# Patient Record
Sex: Male | Born: 1940
Health system: Southern US, Community
[De-identification: ages and names within clinical notes are randomized; demographics above are authoritative.]

## PROBLEM LIST (undated history)

## (undated) DIAGNOSIS — I739 Peripheral vascular disease, unspecified: Secondary | ICD-10-CM

## (undated) DIAGNOSIS — I779 Disorder of arteries and arterioles, unspecified: Secondary | ICD-10-CM

## (undated) DIAGNOSIS — Z9289 Personal history of other medical treatment: Secondary | ICD-10-CM

## (undated) DIAGNOSIS — T7840XA Allergy, unspecified, initial encounter: Secondary | ICD-10-CM

## (undated) DIAGNOSIS — I10 Essential (primary) hypertension: Secondary | ICD-10-CM

## (undated) DIAGNOSIS — J449 Chronic obstructive pulmonary disease, unspecified: Secondary | ICD-10-CM

## (undated) DIAGNOSIS — E1169 Type 2 diabetes mellitus with other specified complication: Secondary | ICD-10-CM

## (undated) DIAGNOSIS — J45909 Unspecified asthma, uncomplicated: Secondary | ICD-10-CM

## (undated) DIAGNOSIS — Z8673 Personal history of transient ischemic attack (TIA), and cerebral infarction without residual deficits: Secondary | ICD-10-CM

## (undated) DIAGNOSIS — C457 Mesothelioma of other sites: Secondary | ICD-10-CM

## (undated) DIAGNOSIS — E785 Hyperlipidemia, unspecified: Secondary | ICD-10-CM

## (undated) HISTORY — DX: Personal history of transient ischemic attack (TIA), and cerebral infarction without residual deficits: Z86.73

## (undated) HISTORY — DX: Peripheral vascular disease, unspecified: I73.9

## (undated) HISTORY — DX: Personal history of other medical treatment: Z92.89

## (undated) HISTORY — DX: Hyperlipidemia, unspecified: E78.5

## (undated) HISTORY — DX: Allergy, unspecified, initial encounter: T78.40XA

## (undated) HISTORY — PX: CATARACT EXTRACTION: SUR2

## (undated) HISTORY — DX: Disorder of arteries and arterioles, unspecified: I77.9

## (undated) HISTORY — PX: SKIN GRAFT: SHX250

## (undated) HISTORY — PX: EYE SURGERY: SHX253

## (undated) HISTORY — DX: Type 2 diabetes mellitus with other specified complication: E11.69

## (undated) HISTORY — PX: TONSILLECTOMY: SUR1361

## (undated) HISTORY — DX: Unspecified asthma, uncomplicated: J45.909

---

## 2012-05-04 DIAGNOSIS — Z23 Encounter for immunization: Secondary | ICD-10-CM | POA: Diagnosis not present

## 2013-01-19 DIAGNOSIS — H524 Presbyopia: Secondary | ICD-10-CM | POA: Diagnosis not present

## 2013-01-19 DIAGNOSIS — IMO0002 Reserved for concepts with insufficient information to code with codable children: Secondary | ICD-10-CM | POA: Diagnosis not present

## 2013-01-19 DIAGNOSIS — H5200 Hypermetropia, unspecified eye: Secondary | ICD-10-CM | POA: Diagnosis not present

## 2013-01-19 DIAGNOSIS — H52229 Regular astigmatism, unspecified eye: Secondary | ICD-10-CM | POA: Diagnosis not present

## 2013-07-12 DIAGNOSIS — IMO0002 Reserved for concepts with insufficient information to code with codable children: Secondary | ICD-10-CM | POA: Diagnosis not present

## 2013-07-12 DIAGNOSIS — J209 Acute bronchitis, unspecified: Secondary | ICD-10-CM | POA: Diagnosis not present

## 2013-07-12 DIAGNOSIS — Z125 Encounter for screening for malignant neoplasm of prostate: Secondary | ICD-10-CM | POA: Diagnosis not present

## 2013-07-28 DIAGNOSIS — Z23 Encounter for immunization: Secondary | ICD-10-CM | POA: Diagnosis not present

## 2014-03-27 DIAGNOSIS — J45909 Unspecified asthma, uncomplicated: Secondary | ICD-10-CM | POA: Diagnosis not present

## 2014-03-27 DIAGNOSIS — J309 Allergic rhinitis, unspecified: Secondary | ICD-10-CM | POA: Diagnosis not present

## 2014-03-27 DIAGNOSIS — Z6825 Body mass index (BMI) 25.0-25.9, adult: Secondary | ICD-10-CM | POA: Diagnosis not present

## 2014-06-21 DIAGNOSIS — H3530 Unspecified macular degeneration: Secondary | ICD-10-CM | POA: Diagnosis not present

## 2014-06-21 DIAGNOSIS — H524 Presbyopia: Secondary | ICD-10-CM | POA: Diagnosis not present

## 2014-06-21 DIAGNOSIS — H3531 Nonexudative age-related macular degeneration: Secondary | ICD-10-CM | POA: Diagnosis not present

## 2014-06-21 DIAGNOSIS — H35373 Puckering of macula, bilateral: Secondary | ICD-10-CM | POA: Diagnosis not present

## 2014-06-21 DIAGNOSIS — H5202 Hypermetropia, left eye: Secondary | ICD-10-CM | POA: Diagnosis not present

## 2014-06-21 DIAGNOSIS — H5201 Hypermetropia, right eye: Secondary | ICD-10-CM | POA: Diagnosis not present

## 2014-06-21 DIAGNOSIS — H25813 Combined forms of age-related cataract, bilateral: Secondary | ICD-10-CM | POA: Diagnosis not present

## 2014-06-21 DIAGNOSIS — H52222 Regular astigmatism, left eye: Secondary | ICD-10-CM | POA: Diagnosis not present

## 2014-06-22 DIAGNOSIS — Z23 Encounter for immunization: Secondary | ICD-10-CM | POA: Diagnosis not present

## 2014-09-10 DIAGNOSIS — Z6826 Body mass index (BMI) 26.0-26.9, adult: Secondary | ICD-10-CM | POA: Diagnosis not present

## 2014-09-10 DIAGNOSIS — J449 Chronic obstructive pulmonary disease, unspecified: Secondary | ICD-10-CM | POA: Diagnosis not present

## 2014-09-10 DIAGNOSIS — Z720 Tobacco use: Secondary | ICD-10-CM | POA: Diagnosis not present

## 2014-09-10 DIAGNOSIS — J321 Chronic frontal sinusitis: Secondary | ICD-10-CM | POA: Diagnosis not present

## 2014-09-10 DIAGNOSIS — R0602 Shortness of breath: Secondary | ICD-10-CM | POA: Diagnosis not present

## 2014-10-18 DIAGNOSIS — Z125 Encounter for screening for malignant neoplasm of prostate: Secondary | ICD-10-CM | POA: Diagnosis not present

## 2014-10-18 DIAGNOSIS — J61 Pneumoconiosis due to asbestos and other mineral fibers: Secondary | ICD-10-CM | POA: Diagnosis not present

## 2014-10-18 DIAGNOSIS — J45909 Unspecified asthma, uncomplicated: Secondary | ICD-10-CM | POA: Diagnosis not present

## 2014-10-18 DIAGNOSIS — Z9181 History of falling: Secondary | ICD-10-CM | POA: Diagnosis not present

## 2014-10-18 DIAGNOSIS — Z1211 Encounter for screening for malignant neoplasm of colon: Secondary | ICD-10-CM | POA: Diagnosis not present

## 2014-10-18 DIAGNOSIS — R635 Abnormal weight gain: Secondary | ICD-10-CM | POA: Diagnosis not present

## 2014-10-18 DIAGNOSIS — Z720 Tobacco use: Secondary | ICD-10-CM | POA: Diagnosis not present

## 2014-10-18 DIAGNOSIS — E785 Hyperlipidemia, unspecified: Secondary | ICD-10-CM | POA: Diagnosis not present

## 2014-10-18 DIAGNOSIS — Z1389 Encounter for screening for other disorder: Secondary | ICD-10-CM | POA: Diagnosis not present

## 2014-10-18 DIAGNOSIS — Z23 Encounter for immunization: Secondary | ICD-10-CM | POA: Diagnosis not present

## 2014-10-18 DIAGNOSIS — Z6826 Body mass index (BMI) 26.0-26.9, adult: Secondary | ICD-10-CM | POA: Diagnosis not present

## 2014-10-18 DIAGNOSIS — Z0000 Encounter for general adult medical examination without abnormal findings: Secondary | ICD-10-CM | POA: Diagnosis not present

## 2014-11-29 DIAGNOSIS — J208 Acute bronchitis due to other specified organisms: Secondary | ICD-10-CM | POA: Diagnosis not present

## 2014-11-29 DIAGNOSIS — Z6825 Body mass index (BMI) 25.0-25.9, adult: Secondary | ICD-10-CM | POA: Diagnosis not present

## 2014-12-31 DIAGNOSIS — H3531 Nonexudative age-related macular degeneration: Secondary | ICD-10-CM | POA: Diagnosis not present

## 2015-05-09 DIAGNOSIS — Z23 Encounter for immunization: Secondary | ICD-10-CM | POA: Diagnosis not present

## 2015-05-21 DIAGNOSIS — J208 Acute bronchitis due to other specified organisms: Secondary | ICD-10-CM | POA: Diagnosis not present

## 2015-05-21 DIAGNOSIS — Z6825 Body mass index (BMI) 25.0-25.9, adult: Secondary | ICD-10-CM | POA: Diagnosis not present

## 2015-07-12 DIAGNOSIS — Z6824 Body mass index (BMI) 24.0-24.9, adult: Secondary | ICD-10-CM | POA: Diagnosis not present

## 2015-07-12 DIAGNOSIS — J019 Acute sinusitis, unspecified: Secondary | ICD-10-CM | POA: Diagnosis not present

## 2015-07-12 DIAGNOSIS — J208 Acute bronchitis due to other specified organisms: Secondary | ICD-10-CM | POA: Diagnosis not present

## 2015-07-23 DIAGNOSIS — H52223 Regular astigmatism, bilateral: Secondary | ICD-10-CM | POA: Diagnosis not present

## 2015-07-23 DIAGNOSIS — H524 Presbyopia: Secondary | ICD-10-CM | POA: Diagnosis not present

## 2015-07-23 DIAGNOSIS — H5201 Hypermetropia, right eye: Secondary | ICD-10-CM | POA: Diagnosis not present

## 2015-07-23 DIAGNOSIS — H43813 Vitreous degeneration, bilateral: Secondary | ICD-10-CM | POA: Diagnosis not present

## 2015-07-23 DIAGNOSIS — H25813 Combined forms of age-related cataract, bilateral: Secondary | ICD-10-CM | POA: Diagnosis not present

## 2015-07-23 DIAGNOSIS — H35373 Puckering of macula, bilateral: Secondary | ICD-10-CM | POA: Diagnosis not present

## 2015-07-23 DIAGNOSIS — H353131 Nonexudative age-related macular degeneration, bilateral, early dry stage: Secondary | ICD-10-CM | POA: Diagnosis not present

## 2015-07-23 DIAGNOSIS — H43393 Other vitreous opacities, bilateral: Secondary | ICD-10-CM | POA: Diagnosis not present

## 2015-07-23 DIAGNOSIS — H43313 Vitreous membranes and strands, bilateral: Secondary | ICD-10-CM | POA: Diagnosis not present

## 2015-10-03 DIAGNOSIS — H2513 Age-related nuclear cataract, bilateral: Secondary | ICD-10-CM | POA: Diagnosis not present

## 2015-10-03 DIAGNOSIS — H35363 Drusen (degenerative) of macula, bilateral: Secondary | ICD-10-CM | POA: Diagnosis not present

## 2015-10-03 DIAGNOSIS — H25813 Combined forms of age-related cataract, bilateral: Secondary | ICD-10-CM | POA: Diagnosis not present

## 2015-10-03 DIAGNOSIS — H353131 Nonexudative age-related macular degeneration, bilateral, early dry stage: Secondary | ICD-10-CM | POA: Diagnosis not present

## 2015-10-03 DIAGNOSIS — H524 Presbyopia: Secondary | ICD-10-CM | POA: Diagnosis not present

## 2015-10-03 DIAGNOSIS — H35453 Secondary pigmentary degeneration, bilateral: Secondary | ICD-10-CM | POA: Diagnosis not present

## 2015-10-03 DIAGNOSIS — H04123 Dry eye syndrome of bilateral lacrimal glands: Secondary | ICD-10-CM | POA: Diagnosis not present

## 2015-10-07 DIAGNOSIS — Z720 Tobacco use: Secondary | ICD-10-CM | POA: Diagnosis not present

## 2015-10-07 DIAGNOSIS — R0982 Postnasal drip: Secondary | ICD-10-CM | POA: Diagnosis not present

## 2015-10-07 DIAGNOSIS — Z6825 Body mass index (BMI) 25.0-25.9, adult: Secondary | ICD-10-CM | POA: Diagnosis not present

## 2015-10-14 DIAGNOSIS — H2512 Age-related nuclear cataract, left eye: Secondary | ICD-10-CM | POA: Diagnosis not present

## 2015-10-28 DIAGNOSIS — J929 Pleural plaque without asbestos: Secondary | ICD-10-CM | POA: Diagnosis not present

## 2015-10-28 DIAGNOSIS — J208 Acute bronchitis due to other specified organisms: Secondary | ICD-10-CM | POA: Diagnosis not present

## 2015-10-28 DIAGNOSIS — Z6824 Body mass index (BMI) 24.0-24.9, adult: Secondary | ICD-10-CM | POA: Diagnosis not present

## 2015-10-28 DIAGNOSIS — Z720 Tobacco use: Secondary | ICD-10-CM | POA: Diagnosis not present

## 2015-10-28 DIAGNOSIS — J61 Pneumoconiosis due to asbestos and other mineral fibers: Secondary | ICD-10-CM | POA: Diagnosis not present

## 2015-10-28 DIAGNOSIS — J449 Chronic obstructive pulmonary disease, unspecified: Secondary | ICD-10-CM | POA: Diagnosis not present

## 2015-11-11 DIAGNOSIS — H25812 Combined forms of age-related cataract, left eye: Secondary | ICD-10-CM | POA: Diagnosis not present

## 2015-11-11 DIAGNOSIS — H2512 Age-related nuclear cataract, left eye: Secondary | ICD-10-CM | POA: Diagnosis not present

## 2015-11-19 DIAGNOSIS — H2511 Age-related nuclear cataract, right eye: Secondary | ICD-10-CM | POA: Diagnosis not present

## 2015-11-25 DIAGNOSIS — H25811 Combined forms of age-related cataract, right eye: Secondary | ICD-10-CM | POA: Diagnosis not present

## 2015-11-25 DIAGNOSIS — H2511 Age-related nuclear cataract, right eye: Secondary | ICD-10-CM | POA: Diagnosis not present

## 2015-12-13 DIAGNOSIS — H3581 Retinal edema: Secondary | ICD-10-CM | POA: Diagnosis not present

## 2015-12-13 DIAGNOSIS — H35371 Puckering of macula, right eye: Secondary | ICD-10-CM | POA: Diagnosis not present

## 2015-12-13 DIAGNOSIS — H43811 Vitreous degeneration, right eye: Secondary | ICD-10-CM | POA: Diagnosis not present

## 2015-12-13 DIAGNOSIS — H43823 Vitreomacular adhesion, bilateral: Secondary | ICD-10-CM | POA: Diagnosis not present

## 2015-12-16 DIAGNOSIS — Z720 Tobacco use: Secondary | ICD-10-CM | POA: Diagnosis not present

## 2015-12-16 DIAGNOSIS — J920 Pleural plaque with presence of asbestos: Secondary | ICD-10-CM | POA: Diagnosis not present

## 2015-12-16 DIAGNOSIS — R0982 Postnasal drip: Secondary | ICD-10-CM | POA: Diagnosis not present

## 2015-12-16 DIAGNOSIS — J449 Chronic obstructive pulmonary disease, unspecified: Secondary | ICD-10-CM | POA: Diagnosis not present

## 2015-12-30 DIAGNOSIS — Z720 Tobacco use: Secondary | ICD-10-CM | POA: Diagnosis not present

## 2015-12-30 DIAGNOSIS — Z6825 Body mass index (BMI) 25.0-25.9, adult: Secondary | ICD-10-CM | POA: Diagnosis not present

## 2015-12-30 DIAGNOSIS — J449 Chronic obstructive pulmonary disease, unspecified: Secondary | ICD-10-CM | POA: Diagnosis not present

## 2015-12-30 DIAGNOSIS — E663 Overweight: Secondary | ICD-10-CM | POA: Diagnosis not present

## 2016-02-14 DIAGNOSIS — R29810 Facial weakness: Secondary | ICD-10-CM | POA: Diagnosis not present

## 2016-02-14 DIAGNOSIS — R531 Weakness: Secondary | ICD-10-CM | POA: Diagnosis not present

## 2016-02-14 DIAGNOSIS — J9690 Respiratory failure, unspecified, unspecified whether with hypoxia or hypercapnia: Secondary | ICD-10-CM | POA: Diagnosis not present

## 2016-02-14 DIAGNOSIS — Z716 Tobacco abuse counseling: Secondary | ICD-10-CM | POA: Diagnosis not present

## 2016-02-14 DIAGNOSIS — J449 Chronic obstructive pulmonary disease, unspecified: Secondary | ICD-10-CM | POA: Diagnosis not present

## 2016-02-14 DIAGNOSIS — Z7982 Long term (current) use of aspirin: Secondary | ICD-10-CM | POA: Diagnosis not present

## 2016-02-14 DIAGNOSIS — I639 Cerebral infarction, unspecified: Secondary | ICD-10-CM | POA: Diagnosis not present

## 2016-02-14 DIAGNOSIS — R200 Anesthesia of skin: Secondary | ICD-10-CM | POA: Diagnosis not present

## 2016-02-14 DIAGNOSIS — Z79899 Other long term (current) drug therapy: Secondary | ICD-10-CM | POA: Diagnosis not present

## 2016-02-14 DIAGNOSIS — F17210 Nicotine dependence, cigarettes, uncomplicated: Secondary | ICD-10-CM | POA: Diagnosis not present

## 2016-02-14 DIAGNOSIS — G459 Transient cerebral ischemic attack, unspecified: Secondary | ICD-10-CM | POA: Diagnosis not present

## 2016-02-14 DIAGNOSIS — E785 Hyperlipidemia, unspecified: Secondary | ICD-10-CM | POA: Diagnosis not present

## 2016-02-14 DIAGNOSIS — R29818 Other symptoms and signs involving the nervous system: Secondary | ICD-10-CM | POA: Diagnosis not present

## 2016-02-15 DIAGNOSIS — I639 Cerebral infarction, unspecified: Secondary | ICD-10-CM | POA: Diagnosis not present

## 2016-02-15 DIAGNOSIS — I361 Nonrheumatic tricuspid (valve) insufficiency: Secondary | ICD-10-CM | POA: Diagnosis not present

## 2016-02-15 DIAGNOSIS — I6523 Occlusion and stenosis of bilateral carotid arteries: Secondary | ICD-10-CM | POA: Diagnosis not present

## 2016-02-17 DIAGNOSIS — E785 Hyperlipidemia, unspecified: Secondary | ICD-10-CM | POA: Diagnosis not present

## 2016-02-17 DIAGNOSIS — Z6825 Body mass index (BMI) 25.0-25.9, adult: Secondary | ICD-10-CM | POA: Diagnosis not present

## 2016-02-17 DIAGNOSIS — Z720 Tobacco use: Secondary | ICD-10-CM | POA: Diagnosis not present

## 2016-02-17 DIAGNOSIS — I6521 Occlusion and stenosis of right carotid artery: Secondary | ICD-10-CM | POA: Diagnosis not present

## 2016-02-17 DIAGNOSIS — Z79899 Other long term (current) drug therapy: Secondary | ICD-10-CM | POA: Diagnosis not present

## 2016-02-17 DIAGNOSIS — Z125 Encounter for screening for malignant neoplasm of prostate: Secondary | ICD-10-CM | POA: Diagnosis not present

## 2016-02-25 ENCOUNTER — Encounter: Payer: Self-pay | Admitting: Vascular Surgery

## 2016-02-28 ENCOUNTER — Ambulatory Visit (INDEPENDENT_AMBULATORY_CARE_PROVIDER_SITE_OTHER): Payer: Medicare Other | Admitting: Vascular Surgery

## 2016-02-28 ENCOUNTER — Ambulatory Visit (HOSPITAL_COMMUNITY)
Admission: RE | Admit: 2016-02-28 | Discharge: 2016-02-28 | Disposition: A | Discharge status: Home / Self Care | Payer: Medicare Other | Source: Ambulatory Visit | Attending: Vascular Surgery | Admitting: Vascular Surgery

## 2016-02-28 ENCOUNTER — Encounter: Payer: Self-pay | Admitting: Vascular Surgery

## 2016-02-28 VITALS — BP 141/86 | HR 89 | Temp 98.3°F | Resp 18 | Ht 70.5 in | Wt 174.0 lb

## 2016-02-28 DIAGNOSIS — Z01810 Encounter for preprocedural cardiovascular examination: Secondary | ICD-10-CM

## 2016-02-28 DIAGNOSIS — I6523 Occlusion and stenosis of bilateral carotid arteries: Secondary | ICD-10-CM

## 2016-02-28 DIAGNOSIS — I6521 Occlusion and stenosis of right carotid artery: Secondary | ICD-10-CM

## 2016-02-28 NOTE — Progress Notes (Signed)
Patient ID: John Wiggins, male   DOB: 1940-10-28, 75 y.o.   MRN: [id number]  Reason for Consult: New Evaluation (carotid stenosis)   Referred by Nicoletta Dress, MD  Subjective:     HPI:  John Wiggins is a 75 y.o. male that presented 2 weeks ago to [facility] with symptoms of left upper extremity weakness that came on suddenly. He had no previous history of stroke amaurosis or TIA. MRI and CT scan during that hospitalization demonstrated no strokes and he was diagnosed with TIA. He had ultrasound which demonstrated to 69% stenosis at [facility]. He now presents 2 weeks later having no further symptoms and he has been taking aspirin and statin as prescribed that time. He otherwise has a history of COPD for which he takes Provera inhalers only. He has no chest pain palpitations shortness of breath is not on blood thinners and has no limitations to his walking other than shortness of breath. He is able to walk 1 flight of stairs although will be short winded.  Past Medical History:  Diagnosis Date  . Allergy   . Hyperlipidemia   . Stroke [facility])    TIA   No family history on file. No past surgical history on file.  Short Social History:  Social History  Substance Use Topics  . Smoking status: Current Every Day Smoker    Years: 60.00    Types: Cigarettes  . Smokeless tobacco: Never Used     Comment: trying to quit   . Alcohol use No    No Known Allergies  Current Outpatient Prescriptions  Medication Sig Dispense Refill  . aspirin EC 81 MG tablet Take 81 mg by mouth daily.    Marland Kitchen atorvastatin (LIPITOR) 80 MG tablet     . azelastine (ASTELIN) 0.1 % nasal spray Place 2 sprays into both nostrils 2 (two) times daily. Use in each nostril as directed    . PROAIR HFA 108 (90 Base) MCG/ACT inhaler      No current facility-administered medications for this visit.     Review of Systems  Constitutional: Negative for chills, diaphoresis, fatigue and fever.  Eyes:  Negative for loss of vision.  Respiratory: Positive for shortness of breath and wheezing. Negative for cough.  Cardiovascular: Positive for dyspnea with exertion. Negative for chest pain, chest tightness, claudication, leg swelling, near-syncope, palpitations and syncope.  GI: Negative for abdominal pain, diarrhea, rectal pain and vomiting.  GU: Negative for difficulty urinating and hematuria.  Musculoskeletal: Negative for leg pain.  Skin: Negative for wound.  Neurological: Negative for dizziness, focal weakness and speech difficulty.        Objective:  Objective   Vitals:   02/28/16 1451 02/28/16 1507  BP: 140/78 (!) 141/86  Pulse: 89 89  Resp: 18   Temp: 98.3 F (36.8 C)   SpO2: 97%   Weight: 174 lb (78.9 kg)   Height: 5' 10.5" (1.791 m)    Body mass index is 24.61 kg/m.  Physical Exam  Constitutional: He appears well-developed.  Cardiovascular: Normal rate and regular rhythm.   Pulses:      Carotid pulses are 2+ on the right side, and 2+ on the left side.      Radial pulses are 2+ on the right side, and 2+ on the left side.       Femoral pulses are 2+ on the right side, and 2+ on the left side.      Popliteal pulses are 2+ on the right  side, and 2+ on the left side.       Dorsalis pedis pulses are 2+ on the right side, and 2+ on the left side.       Posterior tibial pulses are 2+ on the right side, and 2+ on the left side.  Pulmonary/Chest: Effort normal.  Musculoskeletal: Normal range of motion.    Data: 40-59% stenosis of the right ICA by color Doppler today.     Assessment/Plan:  75 year old white male with recent history of TIA with left upper extremity affected. He has 40-59% stenosis of his right carotid. He will need cardiac workup which we will pursue early next week as well as CT angiogram in the interim. We will tentatively plan for right carotid endarterectomy next Thursday, August 31. This will be pending his CTA and his cardiac evaluation. In the  meantime he will continue aspirin and statin and we discussed the signs and symptoms of TIA or stroke which she will presents emergently to the hospital.  We discussed the risk and benefits of carotid endarterectomy including bleeding infection 1-2% risk of stroke 5% risk of nerve injury and risk of anesthesia including myocardial infarction. He understands these risk and is willing to proceed pending results of cardiac evaluation and CT angio.     Waynetta Sandy MD [facility]

## 2016-03-02 ENCOUNTER — Other Ambulatory Visit: Payer: Self-pay

## 2016-03-02 NOTE — Pre-Procedure Instructions (Signed)
Bekim Venne  03/02/2016      [facility] - [address], [place] [address] [place] [address] Phone: [phone] Fax: [phone]    Your procedure is scheduled on August 31  Report to [place] at 0730 A.M.  Call this number if you have problems the morning of surgery:  [phone]   Remember:  Do not eat food or drink liquids after midnight.   Take these medicines the morning of surgery with A SIP OF WATER aspirin, nasal spray, inhaler  7 days prior to surgery STOP taking any Aleve, Naproxen, Ibuprofen, Motrin, Advil, Goody's, BC's, all herbal medications, fish oil, and all vitamins    Do not wear jewelry.  Do not wear lotions, powders, or cologne, or deoderant.  Men may shave face and neck.  Do not bring valuables to the hospital.  [facility] is not responsible for any belongings or valuables.  Contacts, dentures or bridgework may not be worn into surgery.  Leave your suitcase in the car.  After surgery it may be brought to your room.  For patients admitted to the hospital, discharge time will be determined by your treatment team.  Patients discharged the day of surgery will not be allowed to drive home.    Special instructions:   [place]- Preparing For Surgery  Before surgery, you can play an important role. Because skin is not sterile, your skin needs to be as free of germs as possible. You can reduce the number of germs on your skin by washing with CHG (chlorahexidine gluconate) Soap before surgery.  CHG is an antiseptic cleaner which kills germs and bonds with the skin to continue killing germs even after washing.  Please do not use if you have an allergy to CHG or antibacterial soaps. If your skin becomes reddened/irritated stop using the CHG.  Do not shave (including legs and underarms) for at least 48 hours prior to first CHG shower. It is OK  to shave your face.  Please follow these instructions carefully.   1. Shower the NIGHT BEFORE SURGERY and the MORNING OF SURGERY with CHG.   2. If you chose to wash your hair, wash your hair first as usual with your normal shampoo.  3. After you shampoo, rinse your hair and body thoroughly to remove the shampoo.  4. Use CHG as you would any other liquid soap. You can apply CHG directly to the skin and wash gently with a scrungie or a clean washcloth.   5. Apply the CHG Soap to your body ONLY FROM THE NECK DOWN.  Do not use on open wounds or open sores. Avoid contact with your eyes, ears, mouth and genitals (private parts). Wash genitals (private parts) with your normal soap.  6. Wash thoroughly, paying special attention to the area where your surgery will be performed.  7. Thoroughly rinse your body with warm water from the neck down.  8. DO NOT shower/wash with your normal soap after using and rinsing off the CHG Soap.  9. Pat yourself dry with a CLEAN TOWEL.   10. Wear CLEAN PAJAMAS   11. Place CLEAN SHEETS on your bed the night of your first shower and DO NOT SLEEP WITH PETS.    Day of Surgery: Do not apply any deodorants/lotions. Please wear clean clothes to the hospital/surgery center.      Please read over the following  fact sheets that you were given. Pain Booklet, Coughing and Deep Breathing, Blood Transfusion Information, MRSA Information and Surgical Site Infection Prevention

## 2016-03-03 ENCOUNTER — Encounter (HOSPITAL_COMMUNITY): Payer: Self-pay

## 2016-03-03 ENCOUNTER — Encounter (HOSPITAL_COMMUNITY)
Admission: RE | Admit: 2016-03-03 | Discharge: 2016-03-03 | Disposition: A | Discharge status: Home / Self Care | Payer: Medicare Other | Source: Ambulatory Visit | Attending: Vascular Surgery | Admitting: Vascular Surgery

## 2016-03-03 ENCOUNTER — Ambulatory Visit
Admission: RE | Admit: 2016-03-03 | Discharge: 2016-03-03 | Disposition: A | Discharge status: Home / Self Care | Payer: Medicare Other | Source: Ambulatory Visit | Attending: Vascular Surgery | Admitting: Vascular Surgery

## 2016-03-03 ENCOUNTER — Encounter (HOSPITAL_COMMUNITY): Payer: Self-pay | Admitting: Emergency Medicine

## 2016-03-03 ENCOUNTER — Encounter: Payer: Self-pay | Admitting: Internal Medicine

## 2016-03-03 ENCOUNTER — Ambulatory Visit (HOSPITAL_COMMUNITY)
Admission: RE | Admit: 2016-03-03 | Discharge: 2016-03-03 | Disposition: A | Discharge status: Home / Self Care | Payer: Medicare Other | Source: Ambulatory Visit | Attending: Anesthesiology | Admitting: Anesthesiology

## 2016-03-03 DIAGNOSIS — J449 Chronic obstructive pulmonary disease, unspecified: Secondary | ICD-10-CM | POA: Insufficient documentation

## 2016-03-03 DIAGNOSIS — Z01810 Encounter for preprocedural cardiovascular examination: Secondary | ICD-10-CM

## 2016-03-03 DIAGNOSIS — Z77090 Contact with and (suspected) exposure to asbestos: Secondary | ICD-10-CM | POA: Diagnosis not present

## 2016-03-03 DIAGNOSIS — I6521 Occlusion and stenosis of right carotid artery: Secondary | ICD-10-CM

## 2016-03-03 DIAGNOSIS — I6523 Occlusion and stenosis of bilateral carotid arteries: Secondary | ICD-10-CM

## 2016-03-03 DIAGNOSIS — I10 Essential (primary) hypertension: Secondary | ICD-10-CM | POA: Insufficient documentation

## 2016-03-03 DIAGNOSIS — I517 Cardiomegaly: Secondary | ICD-10-CM | POA: Insufficient documentation

## 2016-03-03 DIAGNOSIS — R05 Cough: Secondary | ICD-10-CM | POA: Diagnosis not present

## 2016-03-03 DIAGNOSIS — I700 Atherosclerosis of aorta: Secondary | ICD-10-CM | POA: Insufficient documentation

## 2016-03-03 DIAGNOSIS — R9431 Abnormal electrocardiogram [ECG] [EKG]: Secondary | ICD-10-CM | POA: Diagnosis not present

## 2016-03-03 DIAGNOSIS — R059 Cough, unspecified: Secondary | ICD-10-CM

## 2016-03-03 HISTORY — DX: Mesothelioma of other sites: C45.7

## 2016-03-03 HISTORY — DX: Chronic obstructive pulmonary disease, unspecified: J44.9

## 2016-03-03 HISTORY — DX: Essential (primary) hypertension: I10

## 2016-03-03 HISTORY — DX: Unspecified asthma, uncomplicated: J45.909

## 2016-03-03 LAB — URINALYSIS, ROUTINE W REFLEX MICROSCOPIC
Bilirubin Urine: NEGATIVE
GLUCOSE, UA: NEGATIVE mg/dL
Ketones, ur: NEGATIVE mg/dL
LEUKOCYTES UA: NEGATIVE
Nitrite: NEGATIVE
PH: 5.5 (ref 5.0–8.0)
PROTEIN: NEGATIVE mg/dL
Specific Gravity, Urine: 1.015 (ref 1.005–1.030)

## 2016-03-03 LAB — COMPREHENSIVE METABOLIC PANEL
ALK PHOS: 64 U/L (ref 38–126)
ALT: 14 U/L — AB (ref 17–63)
AST: 18 U/L (ref 15–41)
Albumin: 3.5 g/dL (ref 3.5–5.0)
Anion gap: 9 (ref 5–15)
BILIRUBIN TOTAL: 0.4 mg/dL (ref 0.3–1.2)
BUN: 16 mg/dL (ref 6–20)
CALCIUM: 9.5 mg/dL (ref 8.9–10.3)
CO2: 27 mmol/L (ref 22–32)
CREATININE: 1.09 mg/dL (ref 0.61–1.24)
Chloride: 101 mmol/L (ref 101–111)
Glucose, Bld: 137 mg/dL — ABNORMAL HIGH (ref 65–99)
Potassium: 4.1 mmol/L (ref 3.5–5.1)
Sodium: 137 mmol/L (ref 135–145)
Total Protein: 7.1 g/dL (ref 6.5–8.1)

## 2016-03-03 LAB — TYPE AND SCREEN
ABO/RH(D): B POS
Antibody Screen: NEGATIVE

## 2016-03-03 LAB — APTT: aPTT: 33 seconds (ref 24–36)

## 2016-03-03 LAB — CBC
HCT: 42.6 % (ref 39.0–52.0)
HEMOGLOBIN: 13.6 g/dL (ref 13.0–17.0)
MCH: 28 pg (ref 26.0–34.0)
MCHC: 31.9 g/dL (ref 30.0–36.0)
MCV: 87.7 fL (ref 78.0–100.0)
Platelets: 251 10*3/uL (ref 150–400)
RBC: 4.86 MIL/uL (ref 4.22–5.81)
RDW: 13.5 % (ref 11.5–15.5)
WBC: 11.4 10*3/uL — ABNORMAL HIGH (ref 4.0–10.5)

## 2016-03-03 LAB — URINE MICROSCOPIC-ADD ON: SQUAMOUS EPITHELIAL / LPF: NONE SEEN

## 2016-03-03 LAB — PROTIME-INR
INR: 1.13
PROTHROMBIN TIME: 14.6 s (ref 11.4–15.2)

## 2016-03-03 LAB — ABO/RH: ABO/RH(D): B POS

## 2016-03-03 LAB — SURGICAL PCR SCREEN
MRSA, PCR: NEGATIVE
Staphylococcus aureus: NEGATIVE

## 2016-03-03 MED ORDER — IOPAMIDOL (ISOVUE-370) INJECTION 76%
100.0000 mL | Freq: Once | INTRAVENOUS | Status: AC | PRN
  Administered 2016-03-03: 100 mL via INTRAVENOUS

## 2016-03-03 NOTE — Progress Notes (Signed)
PCP - Dorothea Glassman Cardiologist - Will be seeing Richardson Dopp 03/04/16 for surgical clearance  Chest x-ray - 03/03/16 EKG - 03/03/16 Stress Test - denies ECHO - requesting from [facility] Cardiac Cath - denies    Patient denies fever and chest pain at PAT appointment  Patient does have a cough and shortness of breath from mesothelioma, chest xray completed  Will send to anesthesia for review

## 2016-03-03 NOTE — Progress Notes (Signed)
Cardiology Office Note:    Date:  03/04/2016   ID:  Adhrit Krenz, DOB 21-Jan-1941, MRN [id number]  PCP:  Nicoletta Dress, MD  Cardiologist:  New - seen by Dr. Harrell Gave McAlhany/Athira Janowicz Kathlen Mody, PA-C today  Electrophysiologist:  n/a  Referring MD: Nicoletta Dress, MD   Chief Complaint  Patient presents with  . Other    surgical clearance - referred by Dr. Donzetta Matters    History of Present Illness:    John Wiggins is a 75 y.o. male with a hx of HTN, HL, asbestosis, tobacco abuse.  He was recently admitted to [facility] with a TIA.  He mainly had L upper ext weakness.  His symptoms lasted about 1-2 hours.  He has not had a recurrence.  He tells me that he had an echocardiogram at [facility].  He has not had a stress test.  He saw Dr. Donzetta Matters of [facility] for mod RICA stenosis and he was to be set up for R CEA.  However, he had a neck CTA yesterday with just mild RICA disease.  Dr. Donzetta Matters called him this AM and cancelled the surgery.  He is here today with his wife.  The patient denies chest pain, syncope, edema, orthopnea, PND.  He notes dyspnea with more mod to extreme activities.  He quit smoking last week.    PAD Screen 03/04/2016  Previous PAD dx? No  Previous surgical procedure? No  Pain with walking? No  Feet/toe relief with dangling? No  Painful, non-healing ulcers? No  Extremities discolored? No     Prior CV studies that were reviewed today include:    Carotid US 02/15/16 RICA 50-69%  Neck CTA 03/03/16 IMPRESSION: 1. 35% proximal right ICA stenosis. 2. Mild right ICA cavernous segment stenosis. 3. Aortic atherosclerosis. 4. Left maxillary sinusitis. 5. Moderate chronic small vessel ischemic disease.  Past Medical History:  Diagnosis Date  . Allergy   . Asthma   . Carotid artery disease ([place])    a. Carotid US 8/17: RICA 50-69% // b. Neck CTA 8/17: pRICA 35%  . COPD (chronic obstructive pulmonary disease) ([place])   . History of TIA (transient ischemic attack)    02/2016; L arm weakness  . Hyperlipidemia   . Hypertension   . 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 EYE SURGERY     cataracts  . TONSILLECTOMY      Current Medications: Outpatient Medications Prior to Visit  Medication Sig Dispense Refill  . aspirin EC 81 MG tablet Take 81 mg by mouth daily.    Marland Kitchen atorvastatin (LIPITOR) 80 MG tablet Take 80 mg by mouth daily.     Marland Kitchen azelastine (ASTELIN) 0.1 % nasal spray Place 2 sprays into both nostrils 2 (two) times daily as needed for rhinitis or allergies. Use in each nostril as directed     . PROAIR HFA 108 (90 Base) MCG/ACT inhaler Inhale 1-2 puffs into the lungs every 4 (four) hours as needed for wheezing or shortness of breath.      No facility-administered medications prior to visit.       Allergies:   Review of patient's allergies indicates no known allergies.   Social History   Social History  . Marital status: Married    Spouse name: N/A  . Number of children: N/A  . Years of education: N/A   Social History Main Topics  .  Smoking status: Former Smoker    Years: 60.00    Types: Cigarettes    Quit date: 02/28/2016  . Smokeless tobacco: Never Used     Comment: trying to quit   . Alcohol use No  . Drug use: No  . Sexual activity: Not Asked   Other Topics Concern  . None   Social History Narrative   Retired   Education officer, community for [employer]   Prior worked with [employer]   Wife - Inez Catalina (married in 2014)   2 sons   2 grandsons   Lives in [place], [place]     Family History:  The patient's family history includes Atrial fibrillation in his brother; Heart disease in his father; Hypertension in his mother.   ROS:   Please see the history of present illness.    Review of Systems  Cardiovascular: Positive for dyspnea on exertion.  Respiratory: Positive for cough and wheezing.   Hematologic/Lymphatic:  Bruises/bleeds easily.   All other systems reviewed and are negative.   EKGs/Labs/Other Test Reviewed:    EKG:  EKG is  ordered today.  The ekg ordered today demonstrates NSR, HR 63, normal axis, septal Q waves, QTc 417 ms  Recent Labs: 03/03/2016: ALT 14; BUN 16; Creatinine, Ser 1.09; Hemoglobin 13.6; Platelets 251; Potassium 4.1; Sodium 137   Recent Lipid Panel No results found for: CHOL, TRIG, HDL, CHOLHDL, VLDL, LDLCALC, LDLDIRECT   Physical Exam:    VS:  BP (!) 160/60 (BP Location: Left Arm, Cuff Size: Normal)   Pulse 64   Ht '5\' 11"'  (1.803 m)   Wt 177 lb 6.4 oz (80.5 kg)   BMI 24.74 kg/m     Wt Readings from Last 3 Encounters:  03/04/16 177 lb 6.4 oz (80.5 kg)  03/03/16 176 lb 6.4 oz (80 kg)  02/28/16 174 lb (78.9 kg)     Physical Exam  Constitutional: He is oriented to person, place, and time. He appears well-developed and well-nourished. No distress.  HENT:  Head: Normocephalic and atraumatic.  Eyes: No scleral icterus.  Neck: No JVD present. No thyromegaly present.  Cardiovascular: Normal rate, regular rhythm and normal heart sounds.   No murmur heard. Pulses:      Popliteal pulses are 2+ on the right side, and 2+ on the left side.       Dorsalis pedis pulses are 1+ on the right side, and 1+ on the left side.       Posterior tibial pulses are 1+ on the right side, and 1+ on the left side.  Pulmonary/Chest: He has decreased breath sounds. He has no wheezes. He has no rhonchi. He has no rales.  Abdominal: Soft. There is no tenderness.  Musculoskeletal: He exhibits no edema.  Neurological: He is alert and oriented to person, place, and time.  Skin: Skin is warm and dry.  Bilateral feet with changes c/w tinea pedis  Psychiatric: He has a normal mood and affect.    ASSESSMENT:    1. Shortness of breath   2. Right-sided carotid artery disease ([place])   3. Essential hypertension   4. HLD (hyperlipidemia)   5. History of TIA (transient ischemic attack)    PLAN:     In order of problems listed above:  1. Shortness of breath - He no longer needs clearance for Carotid Endarterectomy. However, he has several risk factors for CAD including age, gender, HTN, HL, prior smoking hx and FHx of CAD.  Will arrange ETT to screen for  significant CAD.  Continue ASA, statin.  He was also seen by Dr. Lauree Chandler.  FU with cardiology prn or sooner if ETT is abnormal.   2. Carotid artery disease - Mild RICA stenosis by recent CTA. Continue ASA, statin.  FU with [facility] and PCP as planned.  3. HTN - BP uncontrolled.  He is on no meds.  It has been almost a month since his TIA.  I think he can start on medication now.  Start Losartan 25 mg QD.  Check BMET in 1 week with PCP.    4. HL - Continue statin.  FU with PCP.  5. Hx of TIA - No recurrent symptoms.  He has only mild disease on his neck CTA.  Continue ASA, statin.  Start BP medication as noted.    Medication Adjustments/Labs and Tests Ordered: Current medicines are reviewed at length with the patient today.  Concerns regarding medicines are outlined above.  Medication changes, Labs and Tests ordered today are outlined in the Patient Instructions noted below. Patient Instructions  Medication Instructions:  1) START LOSARTAN 25 mg daily Labwork: You have been given a prescription for labwork. Please get this drawn at your PCP in 1 week (around March 11, 2016). Testing/Procedures: Your physician has requested that you have an exercise tolerance test. For further information please visit [email or web address]. Please also follow instruction sheet, as given. Follow-Up: Your physician recommends that you schedule a follow-up appointment AS NEEDED with Cardiology pending study results. Any Other Special Instructions Will Be Listed Below (If Applicable). If you need a refill on your cardiac medications before your next appointment, please call your pharmacy.  Signed, Richardson Dopp, PA-C  03/04/2016 12:45 PM      [facility] [address], [place], [place]  [address] Phone: [phone]; Fax: [phone]   I met Mr. Malinak today during his visit with Richardson Dopp. I agree with his plan.  Lauree Chandler 03/04/2016 1:21 PM

## 2016-03-04 ENCOUNTER — Other Ambulatory Visit: Payer: Self-pay

## 2016-03-04 ENCOUNTER — Encounter: Payer: Self-pay | Admitting: Physician Assistant

## 2016-03-04 ENCOUNTER — Ambulatory Visit (INDEPENDENT_AMBULATORY_CARE_PROVIDER_SITE_OTHER): Payer: Medicare Other | Admitting: Physician Assistant

## 2016-03-04 VITALS — BP 160/60 | HR 64 | Ht 71.0 in | Wt 177.4 lb

## 2016-03-04 DIAGNOSIS — E785 Hyperlipidemia, unspecified: Secondary | ICD-10-CM | POA: Diagnosis not present

## 2016-03-04 DIAGNOSIS — R0602 Shortness of breath: Secondary | ICD-10-CM

## 2016-03-04 DIAGNOSIS — I779 Disorder of arteries and arterioles, unspecified: Secondary | ICD-10-CM

## 2016-03-04 DIAGNOSIS — I10 Essential (primary) hypertension: Secondary | ICD-10-CM

## 2016-03-04 DIAGNOSIS — Z8673 Personal history of transient ischemic attack (TIA), and cerebral infarction without residual deficits: Secondary | ICD-10-CM

## 2016-03-04 DIAGNOSIS — I6523 Occlusion and stenosis of bilateral carotid arteries: Secondary | ICD-10-CM

## 2016-03-04 DIAGNOSIS — I739 Peripheral vascular disease, unspecified: Secondary | ICD-10-CM

## 2016-03-04 MED ORDER — LOSARTAN POTASSIUM 25 MG PO TABS: 25.0000 mg | ORAL_TABLET | Freq: Every day | ORAL | 2 refills | Status: DC

## 2016-03-04 NOTE — Patient Instructions (Addendum)
Medication Instructions:  1) START LOSARTAN 25 mg daily Labwork: You have been given a prescription for labwork. Please get this drawn at your PCP in 1 week (around March 11, 2016). Testing/Procedures: Your physician has requested that you have an exercise tolerance test. For further information please visit [email or web address]. Please also follow instruction sheet, as given. Follow-Up: Your physician recommends that you schedule a follow-up appointment AS NEEDED with Cardiology pending study results. Any Other Special Instructions Will Be Listed Below (If Applicable). If you need a refill on your cardiac medications before your next appointment, please call your pharmacy.

## 2016-03-04 NOTE — Progress Notes (Signed)
   Reviewed Mr. John Wiggins CT angiogram from 8.29. I have called him to discuss the results of these findings and have informed him that we will not proceed with surgery at this time. He has a less than 40% stenosis of his right side which was symptomatic side over 2 weeks ago. He is now been started on aspirin and statin and has not smoked since the date of the event. I'll plan to see him back in 6 months time with repeat duplex.  Aline Wesche C. Donzetta Matters, MD [facility] [place] Office: [phone] Pager: [phone]

## 2016-03-05 ENCOUNTER — Encounter (HOSPITAL_COMMUNITY): Admission: RE | Payer: Self-pay | Source: Ambulatory Visit

## 2016-03-05 ENCOUNTER — Inpatient Hospital Stay (HOSPITAL_COMMUNITY): Admission: RE | Admit: 2016-03-05 | Payer: Medicare Other | Source: Ambulatory Visit | Admitting: Vascular Surgery

## 2016-03-05 SURGERY — ENDARTERECTOMY, CAROTID
Anesthesia: General | Site: Neck | Laterality: Right

## 2016-03-12 ENCOUNTER — Encounter: Payer: Self-pay | Admitting: Physician Assistant

## 2016-03-17 DIAGNOSIS — E785 Hyperlipidemia, unspecified: Secondary | ICD-10-CM | POA: Diagnosis not present

## 2016-03-17 DIAGNOSIS — J449 Chronic obstructive pulmonary disease, unspecified: Secondary | ICD-10-CM | POA: Diagnosis not present

## 2016-03-17 DIAGNOSIS — I10 Essential (primary) hypertension: Secondary | ICD-10-CM | POA: Diagnosis not present

## 2016-03-17 DIAGNOSIS — Z23 Encounter for immunization: Secondary | ICD-10-CM | POA: Diagnosis not present

## 2016-03-17 DIAGNOSIS — I639 Cerebral infarction, unspecified: Secondary | ICD-10-CM | POA: Diagnosis not present

## 2016-03-18 DIAGNOSIS — F17200 Nicotine dependence, unspecified, uncomplicated: Secondary | ICD-10-CM

## 2016-03-18 DIAGNOSIS — R7301 Impaired fasting glucose: Secondary | ICD-10-CM | POA: Diagnosis not present

## 2016-03-18 DIAGNOSIS — E785 Hyperlipidemia, unspecified: Secondary | ICD-10-CM | POA: Insufficient documentation

## 2016-03-18 DIAGNOSIS — I63441 Cerebral infarction due to embolism of right cerebellar artery: Secondary | ICD-10-CM | POA: Diagnosis not present

## 2016-03-18 DIAGNOSIS — R0683 Snoring: Secondary | ICD-10-CM

## 2016-03-18 HISTORY — DX: Nicotine dependence, unspecified, uncomplicated: F17.200

## 2016-03-18 HISTORY — DX: Snoring: R06.83

## 2016-03-18 HISTORY — DX: Hyperlipidemia, unspecified: E78.5

## 2016-03-27 ENCOUNTER — Encounter: Payer: Self-pay | Admitting: Physician Assistant

## 2016-04-02 DIAGNOSIS — Z6825 Body mass index (BMI) 25.0-25.9, adult: Secondary | ICD-10-CM | POA: Diagnosis not present

## 2016-04-02 DIAGNOSIS — J208 Acute bronchitis due to other specified organisms: Secondary | ICD-10-CM | POA: Diagnosis not present

## 2016-04-06 ENCOUNTER — Telehealth: Payer: Self-pay | Admitting: Vascular Surgery

## 2016-04-06 NOTE — Telephone Encounter (Signed)
6 mo f/u with Carotid on 09/04/16

## 2016-04-06 NOTE — Telephone Encounter (Signed)
-----   Message from Denman George, RN sent at 03/04/2016  9:23 AM EDT ----- Regarding: needs 6 mo f/u with Dr. Donzetta Matters with a carotid duplex Per Dr. Donzetta Matters, schedule for 6 mo. Carotid duplex and office appt. for carotid artery surveillance.

## 2016-04-24 DIAGNOSIS — Z23 Encounter for immunization: Secondary | ICD-10-CM | POA: Diagnosis not present

## 2016-05-11 ENCOUNTER — Other Ambulatory Visit: Payer: Self-pay | Admitting: Physician Assistant

## 2016-05-11 DIAGNOSIS — L820 Inflamed seborrheic keratosis: Secondary | ICD-10-CM | POA: Diagnosis not present

## 2016-05-11 DIAGNOSIS — L578 Other skin changes due to chronic exposure to nonionizing radiation: Secondary | ICD-10-CM | POA: Diagnosis not present

## 2016-06-15 ENCOUNTER — Other Ambulatory Visit: Payer: Self-pay | Admitting: Physician Assistant

## 2016-06-16 NOTE — Telephone Encounter (Signed)
Medication Detail    Disp Refills Start End   losartan (COZAAR) 25 MG tablet 90 tablet 2 05/11/2016    Sig: TAKE 1 TABLET(25 MG) BY MOUTH DAILY   Notes to Pharmacy: **Patient requests 90 days supply**   E-Prescribing Status: Receipt confirmed by pharmacy (05/11/2016 12:49 PM EST)   Pharmacy   [facility] [id number] - [place], [address] [place]

## 2016-06-18 DIAGNOSIS — I10 Essential (primary) hypertension: Secondary | ICD-10-CM | POA: Diagnosis not present

## 2016-06-18 DIAGNOSIS — J449 Chronic obstructive pulmonary disease, unspecified: Secondary | ICD-10-CM | POA: Diagnosis not present

## 2016-06-18 DIAGNOSIS — R7301 Impaired fasting glucose: Secondary | ICD-10-CM | POA: Diagnosis not present

## 2016-06-18 DIAGNOSIS — E785 Hyperlipidemia, unspecified: Secondary | ICD-10-CM | POA: Diagnosis not present

## 2016-06-18 DIAGNOSIS — Z1389 Encounter for screening for other disorder: Secondary | ICD-10-CM | POA: Diagnosis not present

## 2016-06-18 DIAGNOSIS — Z720 Tobacco use: Secondary | ICD-10-CM | POA: Diagnosis not present

## 2016-06-18 DIAGNOSIS — Z9181 History of falling: Secondary | ICD-10-CM | POA: Diagnosis not present

## 2016-06-19 ENCOUNTER — Telehealth: Payer: Self-pay | Admitting: Physician Assistant

## 2016-06-19 NOTE — Telephone Encounter (Signed)
called patient today to reschedule his treadmill he advised he doesn't want to have the treadmill.

## 2016-06-22 NOTE — Telephone Encounter (Signed)
Call if he changes his mind Richardson Dopp, PA-C   06/22/2016 11:16 AM

## 2016-08-28 ENCOUNTER — Encounter: Payer: Self-pay | Admitting: Vascular Surgery

## 2016-09-04 ENCOUNTER — Ambulatory Visit (INDEPENDENT_AMBULATORY_CARE_PROVIDER_SITE_OTHER): Payer: Medicare Other | Admitting: Physician Assistant

## 2016-09-04 ENCOUNTER — Ambulatory Visit (HOSPITAL_COMMUNITY)
Admission: RE | Admit: 2016-09-04 | Discharge: 2016-09-04 | Disposition: A | Discharge status: Home / Self Care | Payer: Medicare Other | Source: Ambulatory Visit | Attending: Vascular Surgery | Admitting: Vascular Surgery

## 2016-09-04 VITALS — BP 167/89 | HR 74 | Temp 97.1°F | Resp 16 | Ht 71.0 in | Wt 172.0 lb

## 2016-09-04 DIAGNOSIS — I6521 Occlusion and stenosis of right carotid artery: Secondary | ICD-10-CM

## 2016-09-04 DIAGNOSIS — I6523 Occlusion and stenosis of bilateral carotid arteries: Secondary | ICD-10-CM | POA: Insufficient documentation

## 2016-09-04 LAB — VAS US CAROTID
LCCAPSYS: 120 cm/s
LEFT ECA DIAS: -15 cm/s
LICAPSYS: -77 cm/s
Left CCA dist dias: 19 cm/s
Left CCA dist sys: 95 cm/s
Left CCA prox dias: 18 cm/s
Left ICA dist dias: -29 cm/s
Left ICA dist sys: -89 cm/s
Left ICA prox dias: -24 cm/s
RCCADSYS: -64 cm/s
RIGHT CCA MID DIAS: -18 cm/s
RIGHT ECA DIAS: -11 cm/s
Right CCA prox dias: 16 cm/s
Right CCA prox sys: 160 cm/s

## 2016-09-04 NOTE — Progress Notes (Signed)
Patient name: John Wiggins MRN: [id number] DOB: [birth year] Sex: male    HPI: Gabrel Aujla is a 76 y.o. male that presented  to [facility] with symptoms of left upper extremity weakness that came on suddenly back in Aug. 2017. He had no previous history of stroke amaurosis or TIA. MRI and CT scan during that hospitalization demonstrated no strokes and he was diagnosed with TIA. He had ultrasound which demonstrated to 69% stenosis at [facility]. He now presents 2 weeks later having no further symptoms and he has been taking aspirin and statin as prescribed that time. He otherwise has a history of COPD for which he takes Provera inhalers only. He has no chest pain palpitations shortness of breath is not on blood thinners and has no limitations to his walking other than shortness of breath.  Other medical problems include. He was scheduled for right CEA which was cancelled after Dr. Donzetta Matters reviewed the CTA.  He has a less than 40% stenosis of his right side which was symptomatic side.   He is here today for a f/u with carotid duplex.  He reports no weakness reoccurrence in his extremities, no amorous fugax, and no difficulties with speech or swallowing.    Past Medical History:  Diagnosis Date  . Allergy   . Asthma   . Carotid artery disease ([place])    a. Carotid US 8/17: RICA 50-69% // b. Neck CTA 8/17: pRICA 35%  . COPD (chronic obstructive pulmonary disease) ([place])   . History of echocardiogram    a. Echo 02/15/16 [facility]): borderline LVH, EF 60-65%, impaired relaxation, mild LAE, mild TR, mild RAE, borderline elevation of pulmonary artery pressure  . History of TIA (transient ischemic attack)    02/2016; L arm weakness  . Hyperlipidemia   . Hypertension   . 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 EYE SURGERY     cataracts  . TONSILLECTOMY      Family  History  Problem Relation Age of Onset  . Hypertension Mother   . Heart disease Father     CAD; s/p CABG  . Atrial fibrillation Brother     SOCIAL HISTORY: Social History   Social History  . Marital status: Married    Spouse name: N/A  . Number of children: N/A  . Years of education: N/A   Occupational History  . Not on file.   Social History Main Topics  . Smoking status: Current Every Day Smoker    Years: 60.00    Types: Cigarettes    Last attempt to quit: 02/28/2016  . Smokeless tobacco: Never Used     Comment: 1/2 pk per day  . Alcohol use No  . Drug use: No  . Sexual activity: Not on file   Other Topics Concern  . Not on file   Social History Narrative   Retired   Worked maintenance for [employer]   Prior worked with [employer]   Wife - Inez Catalina (married in 2014)   2 sons   2 grandsons   Lives in [place], [place]    No Known Allergies  Current Outpatient Prescriptions  Medication Sig Dispense Refill  . albuterol (PROVENTIL) (2.5 MG/3ML) 0.083% nebulizer solution Take 2.5 mg by nebulization every 4 (four) hours as needed for wheezing or shortness of breath.    Marland Kitchen aspirin EC 81  MG tablet Take 81 mg by mouth daily.    Marland Kitchen atorvastatin (LIPITOR) 80 MG tablet Take 80 mg by mouth daily.     Marland Kitchen azelastine (ASTELIN) 0.1 % nasal spray Place 2 sprays into both nostrils 2 (two) times daily as needed for rhinitis or allergies. Use in each nostril as directed     . losartan (COZAAR) 25 MG tablet TAKE 1 TABLET(25 MG) BY MOUTH DAILY 90 tablet 2  . PROAIR HFA 108 (90 Base) MCG/ACT inhaler Inhale 1-2 puffs into the lungs every 4 (four) hours as needed for wheezing or shortness of breath.      No current facility-administered medications for this visit.     ROS:   General:  No weight loss, Fever, chills  HEENT: No recent headaches, no nasal bleeding, no visual changes, no sore throat  Neurologic: No dizziness, blackouts, seizures. No recent symptoms of stroke or  mini- stroke. No recent episodes of slurred speech, or temporary blindness.  Cardiac: No recent episodes of chest pain/pressure, no shortness of breath at rest.  No shortness of breath with exertion.  Denies history of atrial fibrillation or irregular heartbeat  Vascular: No history of rest pain in feet.  No history of claudication.  No history of non-healing ulcer, No history of DVT   Pulmonary: No home oxygen, no productive cough, no hemoptysis,  No asthma, positive wheezing  Musculoskeletal:  [ ]  Arthritis, [ ]  Low back pain,  [ ]  Joint pain  Hematologic:No history of hypercoagulable state.  No history of easy bleeding.  No history of anemia  Gastrointestinal: No hematochezia or melena,  No gastroesophageal reflux, no trouble swallowing  Urinary: [ ]  chronic Kidney disease, [ ]  on HD - [ ]  MWF or [ ]  TTHS, [ ]  Burning with urination, [ ]  Frequent urination, [ ]  Difficulty urinating;   Skin: No rashes  Psychological: No history of anxiety,  No history of depression   Physical Examination  Vitals:   09/04/16 1325 09/04/16 1328  BP: (!) 167/80 (!) 167/89  Pulse: 74   Resp: 16   Temp: 97.1 F (36.2 C)   TempSrc: Oral   SpO2: 98%   Weight: 172 lb (78 kg)   Height: 5\' 11"  (1.803 m)     Body mass index is 23.99 kg/m.  General:  Alert and oriented, no acute distress HEENT: Normal Neck: No bruit or JVD Pulmonary: Clear to auscultation bilaterally, with expiratory wheezing Cardiac: Regular Rate and Rhythm without murmur Abdomen: Soft, non-tender, non-distended, no mass, no scars Skin: No rash Extremity Pulses:  2+ radial, brachial, femoral, dorsalis pedis, posterior tibial pulses bilaterally Musculoskeletal: No deformity or edema  Neurologic: Upper and lower extremity motor 5/5 and symmetric  DATA:  Carotid duplex shows < 40% stenosis bilaterally   ASSESSMENT:   TIA with left UE weakness. Resolved symptoms within 2 hours.   PLAN:   He now has 2 studies showing  minimal calcified plaque in the right ICA via CTA and now a duplex showing < 40% stenosis.  He has had no recurrent symptoms of TIA.  We will have him f/u in 1 year for repeat carotid duplex studies.  We reviewed the signs and symptoms of stroke.  If he has problems or concerns he will call, if he has signs of stroke he will call 911. He will continue to take 81 mg Aspirin and Lipitor daily.    Theda Sers, Shantel Wesely [facility] PA-C [facility] MD: Dr. Donzetta Matters

## 2016-09-07 DIAGNOSIS — H35373 Puckering of macula, bilateral: Secondary | ICD-10-CM | POA: Diagnosis not present

## 2016-09-17 DIAGNOSIS — Z720 Tobacco use: Secondary | ICD-10-CM | POA: Diagnosis not present

## 2016-09-17 DIAGNOSIS — J449 Chronic obstructive pulmonary disease, unspecified: Secondary | ICD-10-CM | POA: Diagnosis not present

## 2016-09-17 DIAGNOSIS — E785 Hyperlipidemia, unspecified: Secondary | ICD-10-CM | POA: Diagnosis not present

## 2016-09-17 DIAGNOSIS — I639 Cerebral infarction, unspecified: Secondary | ICD-10-CM | POA: Diagnosis not present

## 2016-09-17 DIAGNOSIS — S61204A Unspecified open wound of right ring finger without damage to nail, initial encounter: Secondary | ICD-10-CM | POA: Diagnosis not present

## 2016-09-17 DIAGNOSIS — S6991XA Unspecified injury of right wrist, hand and finger(s), initial encounter: Secondary | ICD-10-CM | POA: Diagnosis not present

## 2016-09-17 DIAGNOSIS — R7301 Impaired fasting glucose: Secondary | ICD-10-CM | POA: Diagnosis not present

## 2016-09-17 DIAGNOSIS — Z7982 Long term (current) use of aspirin: Secondary | ICD-10-CM | POA: Diagnosis not present

## 2016-09-17 DIAGNOSIS — I10 Essential (primary) hypertension: Secondary | ICD-10-CM | POA: Diagnosis not present

## 2016-09-17 DIAGNOSIS — F17210 Nicotine dependence, cigarettes, uncomplicated: Secondary | ICD-10-CM | POA: Diagnosis not present

## 2016-09-21 DIAGNOSIS — S67194A Crushing injury of right ring finger, initial encounter: Secondary | ICD-10-CM | POA: Diagnosis not present

## 2016-10-01 DIAGNOSIS — S61204A Unspecified open wound of right ring finger without damage to nail, initial encounter: Secondary | ICD-10-CM | POA: Diagnosis not present

## 2016-10-01 DIAGNOSIS — S67194A Crushing injury of right ring finger, initial encounter: Secondary | ICD-10-CM | POA: Diagnosis not present

## 2016-10-01 DIAGNOSIS — S6981XA Other specified injuries of right wrist, hand and finger(s), initial encounter: Secondary | ICD-10-CM | POA: Diagnosis not present

## 2016-10-01 DIAGNOSIS — S65514A Laceration of blood vessel of right ring finger, initial encounter: Secondary | ICD-10-CM

## 2016-10-01 HISTORY — DX: Laceration of blood vessel of right ring finger, initial encounter: S65.514A

## 2016-10-20 DIAGNOSIS — J208 Acute bronchitis due to other specified organisms: Secondary | ICD-10-CM | POA: Diagnosis not present

## 2016-10-20 DIAGNOSIS — Z6824 Body mass index (BMI) 24.0-24.9, adult: Secondary | ICD-10-CM | POA: Diagnosis not present

## 2016-10-29 DIAGNOSIS — H35341 Macular cyst, hole, or pseudohole, right eye: Secondary | ICD-10-CM | POA: Diagnosis not present

## 2016-11-03 DIAGNOSIS — H35341 Macular cyst, hole, or pseudohole, right eye: Secondary | ICD-10-CM | POA: Diagnosis not present

## 2016-11-03 DIAGNOSIS — H35371 Puckering of macula, right eye: Secondary | ICD-10-CM | POA: Diagnosis not present

## 2016-11-03 DIAGNOSIS — Z01818 Encounter for other preprocedural examination: Secondary | ICD-10-CM | POA: Diagnosis not present

## 2016-11-10 DIAGNOSIS — H35341 Macular cyst, hole, or pseudohole, right eye: Secondary | ICD-10-CM | POA: Diagnosis not present

## 2016-11-10 DIAGNOSIS — H26491 Other secondary cataract, right eye: Secondary | ICD-10-CM | POA: Diagnosis not present

## 2016-11-10 DIAGNOSIS — H35371 Puckering of macula, right eye: Secondary | ICD-10-CM | POA: Diagnosis not present

## 2016-12-10 DIAGNOSIS — H35341 Macular cyst, hole, or pseudohole, right eye: Secondary | ICD-10-CM | POA: Diagnosis not present

## 2016-12-24 DIAGNOSIS — Z720 Tobacco use: Secondary | ICD-10-CM | POA: Diagnosis not present

## 2016-12-24 DIAGNOSIS — I639 Cerebral infarction, unspecified: Secondary | ICD-10-CM | POA: Diagnosis not present

## 2016-12-24 DIAGNOSIS — E785 Hyperlipidemia, unspecified: Secondary | ICD-10-CM | POA: Diagnosis not present

## 2016-12-24 DIAGNOSIS — Z6824 Body mass index (BMI) 24.0-24.9, adult: Secondary | ICD-10-CM | POA: Diagnosis not present

## 2016-12-24 DIAGNOSIS — J449 Chronic obstructive pulmonary disease, unspecified: Secondary | ICD-10-CM | POA: Diagnosis not present

## 2016-12-24 DIAGNOSIS — R7301 Impaired fasting glucose: Secondary | ICD-10-CM | POA: Diagnosis not present

## 2016-12-24 DIAGNOSIS — I10 Essential (primary) hypertension: Secondary | ICD-10-CM | POA: Diagnosis not present

## 2017-01-20 DIAGNOSIS — Z720 Tobacco use: Secondary | ICD-10-CM | POA: Diagnosis not present

## 2017-01-20 DIAGNOSIS — Z87891 Personal history of nicotine dependence: Secondary | ICD-10-CM | POA: Diagnosis not present

## 2017-01-20 DIAGNOSIS — J208 Acute bronchitis due to other specified organisms: Secondary | ICD-10-CM | POA: Diagnosis not present

## 2017-01-20 DIAGNOSIS — R05 Cough: Secondary | ICD-10-CM | POA: Diagnosis not present

## 2017-01-20 DIAGNOSIS — Z6824 Body mass index (BMI) 24.0-24.9, adult: Secondary | ICD-10-CM | POA: Diagnosis not present

## 2017-02-18 DIAGNOSIS — H35373 Puckering of macula, bilateral: Secondary | ICD-10-CM | POA: Diagnosis not present

## 2017-03-30 DIAGNOSIS — Z9181 History of falling: Secondary | ICD-10-CM | POA: Diagnosis not present

## 2017-03-30 DIAGNOSIS — Z125 Encounter for screening for malignant neoplasm of prostate: Secondary | ICD-10-CM | POA: Diagnosis not present

## 2017-03-30 DIAGNOSIS — I639 Cerebral infarction, unspecified: Secondary | ICD-10-CM | POA: Diagnosis not present

## 2017-03-30 DIAGNOSIS — I10 Essential (primary) hypertension: Secondary | ICD-10-CM | POA: Diagnosis not present

## 2017-03-30 DIAGNOSIS — Z6824 Body mass index (BMI) 24.0-24.9, adult: Secondary | ICD-10-CM | POA: Diagnosis not present

## 2017-03-30 DIAGNOSIS — J449 Chronic obstructive pulmonary disease, unspecified: Secondary | ICD-10-CM | POA: Diagnosis not present

## 2017-03-30 DIAGNOSIS — Z720 Tobacco use: Secondary | ICD-10-CM | POA: Diagnosis not present

## 2017-03-30 DIAGNOSIS — Z23 Encounter for immunization: Secondary | ICD-10-CM | POA: Diagnosis not present

## 2017-03-30 DIAGNOSIS — E785 Hyperlipidemia, unspecified: Secondary | ICD-10-CM | POA: Diagnosis not present

## 2017-03-30 DIAGNOSIS — R7301 Impaired fasting glucose: Secondary | ICD-10-CM | POA: Diagnosis not present

## 2017-04-01 DIAGNOSIS — E1169 Type 2 diabetes mellitus with other specified complication: Secondary | ICD-10-CM | POA: Diagnosis not present

## 2017-05-06 DIAGNOSIS — Z6825 Body mass index (BMI) 25.0-25.9, adult: Secondary | ICD-10-CM | POA: Diagnosis not present

## 2017-05-06 DIAGNOSIS — Z720 Tobacco use: Secondary | ICD-10-CM | POA: Diagnosis not present

## 2017-05-06 DIAGNOSIS — Z87891 Personal history of nicotine dependence: Secondary | ICD-10-CM | POA: Diagnosis not present

## 2017-05-06 DIAGNOSIS — J440 Chronic obstructive pulmonary disease with acute lower respiratory infection: Secondary | ICD-10-CM | POA: Diagnosis not present

## 2017-05-10 DIAGNOSIS — J440 Chronic obstructive pulmonary disease with acute lower respiratory infection: Secondary | ICD-10-CM | POA: Diagnosis not present

## 2017-05-10 DIAGNOSIS — Z6826 Body mass index (BMI) 26.0-26.9, adult: Secondary | ICD-10-CM | POA: Diagnosis not present

## 2017-05-21 DIAGNOSIS — J61 Pneumoconiosis due to asbestos and other mineral fibers: Secondary | ICD-10-CM | POA: Diagnosis not present

## 2017-05-21 DIAGNOSIS — Z87891 Personal history of nicotine dependence: Secondary | ICD-10-CM | POA: Diagnosis not present

## 2017-05-21 DIAGNOSIS — J449 Chronic obstructive pulmonary disease, unspecified: Secondary | ICD-10-CM | POA: Diagnosis not present

## 2017-05-21 DIAGNOSIS — R0602 Shortness of breath: Secondary | ICD-10-CM | POA: Diagnosis not present

## 2017-05-21 DIAGNOSIS — I700 Atherosclerosis of aorta: Secondary | ICD-10-CM | POA: Diagnosis not present

## 2017-05-21 DIAGNOSIS — Z720 Tobacco use: Secondary | ICD-10-CM | POA: Diagnosis not present

## 2017-05-21 DIAGNOSIS — Z6825 Body mass index (BMI) 25.0-25.9, adult: Secondary | ICD-10-CM | POA: Diagnosis not present

## 2017-06-15 ENCOUNTER — Institutional Professional Consult (permissible substitution): Payer: Medicare Other | Admitting: Pulmonary Disease

## 2017-07-07 DIAGNOSIS — E1169 Type 2 diabetes mellitus with other specified complication: Secondary | ICD-10-CM | POA: Diagnosis not present

## 2017-07-07 DIAGNOSIS — E785 Hyperlipidemia, unspecified: Secondary | ICD-10-CM | POA: Diagnosis not present

## 2017-07-22 ENCOUNTER — Institutional Professional Consult (permissible substitution): Payer: Medicare Other | Admitting: Pulmonary Disease

## 2017-08-04 DIAGNOSIS — H35373 Puckering of macula, bilateral: Secondary | ICD-10-CM | POA: Diagnosis not present

## 2017-08-05 ENCOUNTER — Other Ambulatory Visit (INDEPENDENT_AMBULATORY_CARE_PROVIDER_SITE_OTHER): Payer: Medicare Other

## 2017-08-05 ENCOUNTER — Encounter: Payer: Self-pay | Admitting: Pulmonary Disease

## 2017-08-05 ENCOUNTER — Ambulatory Visit (INDEPENDENT_AMBULATORY_CARE_PROVIDER_SITE_OTHER): Payer: Medicare Other | Admitting: Pulmonary Disease

## 2017-08-05 VITALS — BP 134/78 | HR 73 | Ht 71.0 in | Wt 166.0 lb

## 2017-08-05 DIAGNOSIS — R0602 Shortness of breath: Secondary | ICD-10-CM

## 2017-08-05 DIAGNOSIS — F17210 Nicotine dependence, cigarettes, uncomplicated: Secondary | ICD-10-CM | POA: Diagnosis not present

## 2017-08-05 DIAGNOSIS — Z77090 Contact with and (suspected) exposure to asbestos: Secondary | ICD-10-CM

## 2017-08-05 LAB — CBC WITH DIFFERENTIAL/PLATELET
Basophils Absolute: 0.2 10*3/uL — ABNORMAL HIGH (ref 0.0–0.1)
Basophils Relative: 1.3 % (ref 0.0–3.0)
EOS PCT: 4.6 % (ref 0.0–5.0)
Eosinophils Absolute: 0.6 10*3/uL (ref 0.0–0.7)
HCT: 43.9 % (ref 39.0–52.0)
Hemoglobin: 14.5 g/dL (ref 13.0–17.0)
LYMPHS ABS: 4.5 10*3/uL — AB (ref 0.7–4.0)
Lymphocytes Relative: 34.1 % (ref 12.0–46.0)
MCHC: 32.9 g/dL (ref 30.0–36.0)
MCV: 84.8 fl (ref 78.0–100.0)
MONOS PCT: 7.3 % (ref 3.0–12.0)
Monocytes Absolute: 1 10*3/uL (ref 0.1–1.0)
Neutro Abs: 7 10*3/uL (ref 1.4–7.7)
Neutrophils Relative %: 52.7 % (ref 43.0–77.0)
Platelets: 214 10*3/uL (ref 150.0–400.0)
RBC: 5.18 Mil/uL (ref 4.22–5.81)
RDW: 15.2 % (ref 11.5–15.5)
WBC: 13.3 10*3/uL — ABNORMAL HIGH (ref 4.0–10.5)

## 2017-08-05 MED ORDER — VARENICLINE TARTRATE 0.5 MG X 11 & 1 MG X 42 PO MISC: ORAL | 0 refills | Status: DC

## 2017-08-05 MED ORDER — NICOTINE 21-14-7 MG/24HR TD KIT: PACK | TRANSDERMAL | 0 refills | Status: DC

## 2017-08-05 MED ORDER — GLYCOPYRROLATE-FORMOTEROL 9-4.8 MCG/ACT IN AERO: 2.0000 | INHALATION_SPRAY | Freq: Two times a day (BID) | RESPIRATORY_TRACT | 5 refills | Status: DC

## 2017-08-05 MED ORDER — GLYCOPYRROLATE-FORMOTEROL 9-4.8 MCG/ACT IN AERO: 2.0000 | INHALATION_SPRAY | Freq: Two times a day (BID) | RESPIRATORY_TRACT | 0 refills | Status: AC

## 2017-08-05 NOTE — Patient Instructions (Signed)
Get a CBC differential, blood allergy profile, alpha-1 antitrypsin levels and phenotype We will schedule you for pulmonary function test and high-resolution CT of the chest We will start you on stiolto inhaler Start nicotine patches and Chantix for smoking cessation. Follow-up in 1 month.

## 2017-08-05 NOTE — Progress Notes (Signed)
John Wiggins    [id number]    April 14, 1941  Primary Care Physician:Schultz, Lora Havens, MD  Referring Physician: Nicoletta Dress, MD [address] [place], [place] [address]  Chief complaint: Consult for evaluation of COPD, asbestos exposure  HPI: 77 year old with history of hypertension, hyperlipidemia, stroke, allergies, asbestos exposure, COPD.  He has complaints of chronic dyspnea on exertion and chronic bronchitis with productive cough, white mucus, wheezing, dyspnea for the past several years.  Denies dyspnea at rest.  He is currently on albuterol inhaler which he uses up to 3 times a day, which helps with his breathing. History noted for significant asbestos exposure in the past and chest imaging showing extensive bilateral calcified pleural plaques.  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 Smoking history: 60-pack-year smoking history.  Continues to smoke 1 pack/day Travel History: Lived in [place] all his life.  No significant travel.  Outpatient Encounter Medications as of 08/05/2017  Medication Sig  . albuterol (PROVENTIL) (2.5 MG/3ML) 0.083% nebulizer solution Take 2.5 mg by nebulization every 4 (four) hours as needed for wheezing or shortness of breath.  Marland Kitchen aspirin EC 81 MG tablet Take 81 mg by mouth daily.  Marland Kitchen atorvastatin (LIPITOR) 80 MG tablet Take 80 mg by mouth daily.   Marland Kitchen losartan (COZAAR) 25 MG tablet TAKE 1 TABLET(25 MG) BY MOUTH DAILY  . PROAIR HFA 108 (90 Base) MCG/ACT inhaler Inhale 1-2 puffs into the lungs every 4 (four) hours as needed for wheezing or shortness of breath.   Marland Kitchen azelastine (ASTELIN) 0.1 % nasal spray Place 2 sprays into both nostrils 2 (two) times daily as needed for rhinitis or allergies. Use in each nostril as directed    No facility-administered encounter medications on file as of  08/05/2017.     Allergies as of 08/05/2017  . (No Known Allergies)    Past Medical History:  Diagnosis Date  . Allergy   . Asthma   . Carotid artery disease ([place])    a. Carotid US 8/17: RICA 50-69% // b. Neck CTA 8/17: pRICA 35%  . COPD (chronic obstructive pulmonary disease) ([place])   . History of echocardiogram    a. Echo 02/15/16 [facility]): borderline LVH, EF 60-65%, impaired relaxation, mild LAE, mild TR, mild RAE, borderline elevation of pulmonary artery pressure  . History of TIA (transient ischemic attack)    02/2016; L arm weakness  . Hyperlipidemia   . Hypertension   . 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 EYE SURGERY     cataracts  . SKIN GRAFT     right finger  . TONSILLECTOMY      Family History  Problem Relation Age of Onset  . Hypertension Mother   . Heart disease Father        CAD; s/p CABG  . Atrial fibrillation Brother     Social History   Socioeconomic History  . Marital status: Married    Spouse name: Not on file  . Number of children: Not on file  . Years of education: Not on file  . Highest education level: Not on file  Social Needs  . Financial resource strain: Not on file  . Food insecurity - worry: Not on  file  . Food insecurity - inability: Not on file  . Transportation needs - medical: Not on file  . Transportation needs - non-medical: Not on file  Occupational History  . Not on file  Tobacco Use  . Smoking status: Current Every Day Smoker    Packs/day: 1.00    Years: 60.00    Pack years: 60.00    Types: Cigarettes    Last attempt to quit: 02/28/2016    Years since quitting: 1.4  . Smokeless tobacco: Never Used  . Tobacco comment: 1/2 pk per day  Substance and Sexual Activity  . Alcohol use: No    Comment: social  . Drug use: No  . Sexual activity: Not on file  Other Topics Concern  . Not on file  Social History  Narrative   Retired   Worked maintenance for [employer]   Prior worked with [employer]   Wife - Inez Catalina (married in 2014)   2 sons   2 grandsons   Lives in [place], [place]    Review of systems: Review of Systems  Constitutional: Negative for fever and chills.  HENT: Negative.   Eyes: Negative for blurred vision.  Respiratory: as per HPI  Cardiovascular: Negative for chest pain and palpitations.  Gastrointestinal: Negative for vomiting, diarrhea, blood per rectum. Genitourinary: Negative for dysuria, urgency, frequency and hematuria.  Musculoskeletal: Negative for myalgias, back pain and joint pain.  Skin: Negative for itching and rash.  Neurological: Negative for dizziness, tremors, focal weakness, seizures and loss of consciousness.  Endo/Heme/Allergies: Negative for environmental allergies.  Psychiatric/Behavioral: Negative for depression, suicidal ideas and hallucinations.  All other systems reviewed and are negative.  Physical Exam: Blood pressure 134/78, pulse 73, height 5\' 11"  (1.803 m), weight 166 lb (75.3 kg), SpO2 94 %. Gen:      No acute distress HEENT:  EOMI, sclera anicteric Neck:     No masses; no thyromegaly Lungs:    Clear to auscultation bilaterally; normal respiratory effort CV:         Regular rate and rhythm; no murmurs Abd:      + bowel sounds; soft, non-tender; no palpable masses, no distension Ext:    No edema; adequate peripheral perfusion Skin:      Warm and dry; no rash Neuro: alert and oriented x 3 Psych: normal mood and affect  Data Reviewed: Chest x-ray 05/21/17-calcified bilateral pleural plaques.  Stable compared to prior imaging.  I have reviewed the imaging personally.  Assessment:  Evaluation for COPD, asbestos exposure Check CBC with differential, blood allergy profile, alpha-1 antitrypsin levels and phenotype. Schedule for pulmonary function test and high-resolution CT for evaluation of his lung function Start stiolto inhaler.   Continue albuterol as needed  Current smoker Smoking cessation discussed with him and he is interested in quitting.  Will prescribe nicotine patches and Chantix.  Time spent counseling-5 minutes  Plan/Recommendations: - CBC, blood allergy profile, alpha-1 antitrypsin assessment - PFTs, high-resolution CT - Start stiolto, continue albuterol - Nicotine patches, Chantix  Marshell Garfinkel MD [facility] Pager [phone] 08/05/2017, 3:28 PM  CC: Nicoletta Dress, MD

## 2017-08-09 ENCOUNTER — Telehealth: Payer: Self-pay | Admitting: Pulmonary Disease

## 2017-08-09 NOTE — Telephone Encounter (Signed)
PA request received by [facility] in [place] for chantix starter pack. Initiated PA via [email or web address] Key: [id number] Sent to plan, and a determination is expected within 5 days.    Will route to [facility] for follow-up.

## 2017-08-10 LAB — RESPIRATORY ALLERGY PROFILE REGION II ~~LOC~~
Allergen, A. alternata, m6: <0.1 kU/L
Allergen, Cedar tree, t12: <0.1 kU/L
Allergen, Comm Silver Birch, t9: <0.1 kU/L
Allergen, Cottonwood, t14: <0.1 kU/L
Allergen, P. notatum, m1: <0.1 kU/L
Bermuda Grass: <0.1 kU/L
Box Elder IgE: <0.1 kU/L
CLADOSPORIUM HERBARUM (M2) IGE: <0.1 kU/L
CLASS: 0
CLASS: 0
CLASS: 0
CLASS: 0
CLASS: 0
CLASS: 0
CLASS: 0
CLASS: 0
CLASS: 0
CLASS: 0
CLASS: 0
COMMON RAGWEED (SHORT) (W1) IGE: <0.1 kU/L
Cat Dander: <0.1 kU/L
Class: 0
Class: 0
Class: 0
Class: 0
Class: 0
Class: 0
Class: 0
Class: 0
Class: 0
Class: 0
Class: 0
Class: 0
Class: 0
Cockroach: <0.1 kU/L
Dog Dander: <0.1 kU/L
IgE (Immunoglobulin E), Serum: 120 kU/L — ABNORMAL HIGH (ref <=114)
Pecan/Hickory Tree IgE: <0.1 kU/L
Sheep Sorrel IgE: <0.1 kU/L

## 2017-08-10 LAB — ALPHA-1 ANTITRYPSIN PHENOTYPE: A-1 Antitrypsin, Ser: 214 mg/dL — ABNORMAL HIGH (ref 83–199)

## 2017-08-10 LAB — INTERPRETATION:

## 2017-08-11 NOTE — Telephone Encounter (Signed)
chanix has been approved until 07-05-18. Pt & pharmacy are aware. Nothing further is needed

## 2017-08-18 ENCOUNTER — Ambulatory Visit (INDEPENDENT_AMBULATORY_CARE_PROVIDER_SITE_OTHER)
Admission: RE | Admit: 2017-08-18 | Discharge: 2017-08-18 | Disposition: A | Discharge status: Home / Self Care | Payer: Medicare Other | Source: Ambulatory Visit | Attending: Pulmonary Disease | Admitting: Pulmonary Disease

## 2017-08-18 DIAGNOSIS — R0602 Shortness of breath: Secondary | ICD-10-CM | POA: Diagnosis not present

## 2017-08-18 DIAGNOSIS — R918 Other nonspecific abnormal finding of lung field: Secondary | ICD-10-CM | POA: Diagnosis not present

## 2017-08-24 NOTE — Progress Notes (Signed)
LMTCB on preferred phone number listed for patient. 

## 2017-09-01 ENCOUNTER — Other Ambulatory Visit: Payer: Self-pay

## 2017-09-01 DIAGNOSIS — I6521 Occlusion and stenosis of right carotid artery: Secondary | ICD-10-CM

## 2017-09-10 ENCOUNTER — Ambulatory Visit (INDEPENDENT_AMBULATORY_CARE_PROVIDER_SITE_OTHER): Payer: Medicare Other | Admitting: Family

## 2017-09-10 ENCOUNTER — Encounter: Payer: Self-pay | Admitting: Family

## 2017-09-10 ENCOUNTER — Ambulatory Visit (HOSPITAL_COMMUNITY)
Admission: RE | Admit: 2017-09-10 | Discharge: 2017-09-10 | Disposition: A | Discharge status: Home / Self Care | Payer: Medicare Other | Source: Ambulatory Visit | Attending: Family | Admitting: Family

## 2017-09-10 VITALS — BP 134/76 | HR 76 | Temp 97.1°F | Resp 18 | Ht 71.0 in | Wt 168.0 lb

## 2017-09-10 DIAGNOSIS — I6521 Occlusion and stenosis of right carotid artery: Secondary | ICD-10-CM | POA: Insufficient documentation

## 2017-09-10 DIAGNOSIS — I6523 Occlusion and stenosis of bilateral carotid arteries: Secondary | ICD-10-CM | POA: Diagnosis not present

## 2017-09-10 NOTE — Patient Instructions (Signed)
Steps to Quit Smoking Smoking tobacco can be bad for your health. It can also affect almost every organ in your body. Smoking puts you and people around you at risk for many serious long-lasting (chronic) diseases. Quitting smoking is hard, but it is one of the best things that you can do for your health. It is never too late to quit. What are the benefits of quitting smoking? When you quit smoking, you lower your risk for getting serious diseases and conditions. They can include:  Lung cancer or lung disease.  Heart disease.  Stroke.  Heart attack.  Not being able to have children (infertility).  Weak bones (osteoporosis) and broken bones (fractures).  If you have coughing, wheezing, and shortness of breath, those symptoms may get better when you quit. You may also get sick less often. If you are pregnant, quitting smoking can help to lower your chances of having a baby of low birth weight. What can I do to help me quit smoking? Talk with your doctor about what can help you quit smoking. Some things you can do (strategies) include:  Quitting smoking totally, instead of slowly cutting back how much you smoke over a period of time.  Going to in-person counseling. You are more likely to quit if you go to many counseling sessions.  Using resources and support systems, such as: ? Online chats with a counselor. ? Phone quitlines. ? Printed self-help materials. ? Support groups or group counseling. ? Text messaging programs. ? Mobile phone apps or applications.  Taking medicines. Some of these medicines may have nicotine in them. If you are pregnant or breastfeeding, do not take any medicines to quit smoking unless your doctor says it is okay. Talk with your doctor about counseling or other things that can help you.  Talk with your doctor about using more than one strategy at the same time, such as taking medicines while you are also going to in-person counseling. This can help make  quitting easier. What things can I do to make it easier to quit? Quitting smoking might feel very hard at first, but there is a lot that you can do to make it easier. Take these steps:  Talk to your family and friends. Ask them to support and encourage you.  Call phone quitlines, reach out to support groups, or work with a counselor.  Ask people who smoke to not smoke around you.  Avoid places that make you want (trigger) to smoke, such as: ? Bars. ? Parties. ? Smoke-break areas at work.  Spend time with people who do not smoke.  Lower the stress in your life. Stress can make you want to smoke. Try these things to help your stress: ? Getting regular exercise. ? Deep-breathing exercises. ? Yoga. ? Meditating. ? Doing a body scan. To do this, close your eyes, focus on one area of your body at a time from head to toe, and notice which parts of your body are tense. Try to relax the muscles in those areas.  Download or buy apps on your mobile phone or tablet that can help you stick to your quit plan. There are many free apps, such as QuitGuide from the CDC (Centers for Disease Control and Prevention). You can find more support from smokefree.gov and other websites.  This information is not intended to replace advice given to you by your health care provider. Make sure you discuss any questions you have with your health care provider. Document Released: 04/18/2009 Document   Revised: 02/18/2016 Document Reviewed: 11/06/2014 Elsevier Interactive Patient Education  2018 Elsevier Inc.     Stroke Prevention Some health problems and behaviors may make it more likely for you to have a stroke. Below are ways to lessen your risk of having a stroke.  Be active for at least 30 minutes on most or all days.  Do not smoke. Try not to be around others who smoke.  Do not drink too much alcohol. ? Do not have more than 2 drinks a day if you are a man. ? Do not have more than 1 drink a day if you  are a woman and are not pregnant.  Eat healthy foods, such as fruits and vegetables. If you were put on a specific diet, follow the diet as told.  Keep your cholesterol levels under control through diet and medicines. Look for foods that are low in saturated fat, trans fat, cholesterol, and are high in fiber.  If you have diabetes, follow all diet plans and take your medicine as told.  Ask your doctor if you need treatment to lower your blood pressure. If you have high blood pressure (hypertension), follow all diet plans and take your medicine as told by your doctor.  If you are 18-39 years old, have your blood pressure checked every 3-5 years. If you are age 40 or older, have your blood pressure checked every year.  Keep a healthy weight. Eat foods that are low in calories, salt, saturated fat, trans fat, and cholesterol.  Do not take drugs.  Avoid birth control pills, if this applies. Talk to your doctor about the risks of taking birth control pills.  Talk to your doctor if you have sleep problems (sleep apnea).  Take all medicine as told by your doctor. ? You may be told to take aspirin or blood thinner medicine. Take this medicine as told by your doctor. ? Understand your medicine instructions.  Make sure any other conditions you have are being taken care of.  Get help right away if:  You suddenly lose feeling (you feel numb) or have weakness in your face, arm, or leg.  Your face or eyelid hangs down to one side.  You suddenly feel confused.  You have trouble talking (aphasia) or understanding what people are saying.  You suddenly have trouble seeing in one or both eyes.  You suddenly have trouble walking.  You are dizzy.  You lose your balance or your movements are clumsy (uncoordinated).  You suddenly have a very bad headache and you do not know the cause.  You have new chest pain.  Your heart feels like it is fluttering or skipping a beat (irregular  heartbeat). Do not wait to see if the symptoms above go away. Get help right away. Call your local emergency services (911 in [place] Do not drive yourself to the hospital. This information is not intended to replace advice given to you by your health care provider. Make sure you discuss any questions you have with your health care provider. Document Released: 12/22/2011 Document Revised: 11/28/2015 Document Reviewed: 12/23/2012 Elsevier Interactive Patient Education  2018 Elsevier Inc.  

## 2017-09-10 NOTE — Progress Notes (Signed)
Chief Complaint: Follow up Extracranial Carotid Artery Stenosis   History of Present Illness  John Wiggins is a 77 y.o. male who presented  to [facility] with symptoms of left upper extremity weakness that came on suddenly back in Aug. 2017 that lasted 2-3 hours. He had no previous history of stroke amaurosis or TIA. MRI and CT scan during that hospitalization demonstrated no strokes and he was diagnosed with TIA. He had ultrasound which demonstrated to 69% stenosis at [facility].  He has not had any further symptoms and he has been taking aspirin and statin as prescribed that time.  He otherwise has a history of COPD for which he takes inhalers only.   He also has asbestosis, was a pipe fitter and applied asbestos insulation.  He has no chest pain palpitations shortness of breath is not on blood thinners and has no limitations to his walking other than shortness of breath.    He was scheduled for right CEA which was cancelled after Dr. Donzetta Matters reviewed the CTA.  He has a less than 40% stenosis of his right side which was symptomatic side.  He is here today for a f/u with carotid duplex.  He reports no weakness reoccurrence in his extremities, no amorous fugax, and no difficulties with speech or swallowing.    He denies claudication sx's with walking.   His facial rash seemed to started after he switched to a different shaving cream.   Diabetic: borderline Tobacco use: smoker  (2-3 cigarettes/day, started at age 60 yrs) Is taking Chantix now, had taken it before and resumed smoking after 3 months.   Pt meds include: Statin : yes ASA: yes Other anticoagulants/antiplatelets: no   Past Medical History:  Diagnosis Date  . Allergy   . Asthma   . Carotid artery disease ([place])    a. Carotid US 8/17: RICA 50-69% // b. Neck CTA 8/17: pRICA 35%  . COPD (chronic obstructive pulmonary disease) ([place])   . History of echocardiogram    a. Echo 02/15/16 [facility]):  borderline LVH, EF 60-65%, impaired relaxation, mild LAE, mild TR, mild RAE, borderline elevation of pulmonary artery pressure  . History of TIA (transient ischemic attack)    02/2016; L arm weakness  . Hyperlipidemia   . Hypertension   . 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 Smoking status: Current Some Day Smoker    Packs/day: 0.25    Years: 60.00    Pack years: 15.00    Types: Cigarettes    Last attempt to quit: 02/28/2016    Years since quitting: 1.5  . Smokeless tobacco: Never Used  . Tobacco comment: 2 or 3 cigs a day, on chantix  Substance Use Topics  . Alcohol use: No    Comment: social  . Drug use: No    Family History Family History  Problem Relation Age of Onset  . Hypertension Mother   . Heart disease Father        CAD; s/p CABG  . Atrial fibrillation Brother     Surgical History Past Surgical History:  Procedure Laterality Date  . EYE SURGERY     cataracts  . SKIN GRAFT     right finger  . TONSILLECTOMY      No Known Allergies  Current Outpatient Medications  Medication Sig Dispense Refill  . albuterol (PROVENTIL) (2.5 MG/3ML)  0.083% nebulizer solution Take 2.5 mg by nebulization every 4 (four) hours as needed for wheezing or shortness of breath.    Marland Kitchen aspirin EC 81 MG tablet Take 81 mg by mouth daily.    Marland Kitchen atorvastatin (LIPITOR) 80 MG tablet Take 80 mg by mouth daily.     . Glycopyrrolate-Formoterol (BEVESPI AEROSPHERE) 9-4.8 MCG/ACT AERO Inhale 2 puffs into the lungs 2 (two) times daily. 1 Inhaler 5  . losartan (COZAAR) 25 MG tablet TAKE 1 TABLET(25 MG) BY MOUTH DAILY 90 tablet 2  . Nicotine 21-14-7 MG/24HR KIT Use as directed 56 each 0  . PROAIR HFA 108 (90 Base) MCG/ACT inhaler Inhale 1-2 puffs into the lungs every 4 (four) hours as needed for wheezing or shortness of breath.     . varenicline (CHANTIX PAK) 0.5 MG X 11 & 1 MG X 42 tablet Take one 0.5  mg tablet by mouth once daily for 3 days, then increase to one 0.5 mg tablet twice daily for 4 days, then increase to one 1 mg tablet twice daily. 53 tablet 0  . azelastine (ASTELIN) 0.1 % nasal spray Place 2 sprays into both nostrils 2 (two) times daily as needed for rhinitis or allergies. Use in each nostril as directed      No current facility-administered medications for this visit.     Review of Systems : See HPI for pertinent positives and negatives.  Physical Examination  Vitals:   09/10/17 1438 09/10/17 1440  BP: (!) 142/80 134/76  Pulse: 76   Resp: 18   Temp: (!) 97.1 F (36.2 C)   TempSrc: Oral   SpO2: 98%   Weight: 168 lb (76.2 kg)   Height: '5\' 11"'  (1.803 m)    Body mass index is 23.43 kg/m.  General: WDWN male in NAD GAIT: normal Eyes: PERRLA HENT: No gross abnormalities.  Pulmonary:  Respirations are slightly labored, limited air movement in all fields, no rales, + rhonchi, &  wheezing. Cardiac: regular rhythm, no detected murmur.  VASCULAR EXAM Carotid Bruits Right Left   Negative Negative     Abdominal aortic pulse is not palpable. Radial pulses are 2+ palpable and equal.                                                                                                                            LE Pulses Right Left       POPLITEAL  not palpable   not palpable       POSTERIOR TIBIAL   palpable    palpable        DORSALIS PEDIS      ANTERIOR TIBIAL  palpable   palpable     Gastrointestinal: soft, nontender, BS WNL, no r/g, no palpable masses. Musculoskeletal: No muscle atrophy/wasting. M/S 5/5 throughout, extremities without ischemic changes Skin: no ulcers, no cellulitis. + facial red macular rash, non pruritic Neurologic:  A&O X 3; appropriate affect, sensation is normal; speech is normal, CN  2-12 intact, pain and light touch intact in extremities, motor exam as listed above. Psychiatric: Normal thought content, mood appropriate to clinical  situation.     Assessment: John Wiggins is a 77 y.o. male who had a TIA in August 2017 as manifested by left arm weakness that lasted 2-3 hours; no subsequent stroke or TIA symptoms.    His primary atherosclerotic risk factor is smoking since age 7. He is trying to quit, is taking Chantix.  He takes a daily statin and ASA.   DATA Carotid Duplex (09/10/17): Right ICA: 1-39% stenosis. Left ICA: no stenosis Bilateral vertebral artery flow is antegrade.  Bilateral subclavian artery waveforms are normal.  No change compared to the previous exam on 09-04-16.    Plan: Follow-up in 1 year with Carotid Duplex scan.   The patient was counseled re smoking cessation and given several free resources re smoking cessation.    I discussed in depth with the patient the nature of atherosclerosis, and emphasized the importance of maximal medical management including strict control of blood pressure, blood glucose, and lipid levels, obtaining regular exercise, and cessation of smoking.  The patient is aware that without maximal medical management the underlying atherosclerotic disease process will progress, limiting the benefit of any interventions. The patient was given information about stroke prevention and what symptoms should prompt the patient to seek immediate medical care. Thank you for allowing [place] to participate in this patient's care.  Clemon Chambers, RN, MSN, FNP-C [facility] Office: [phone]  Clinic Physician: Donzetta Matters  09/10/17 2:48 PM

## 2017-09-14 ENCOUNTER — Ambulatory Visit (INDEPENDENT_AMBULATORY_CARE_PROVIDER_SITE_OTHER): Payer: Medicare Other | Admitting: Pulmonary Disease

## 2017-09-14 ENCOUNTER — Encounter: Payer: Self-pay | Admitting: Pulmonary Disease

## 2017-09-14 VITALS — BP 126/62 | HR 66 | Ht 71.0 in | Wt 169.4 lb

## 2017-09-14 DIAGNOSIS — I6523 Occlusion and stenosis of bilateral carotid arteries: Secondary | ICD-10-CM

## 2017-09-14 DIAGNOSIS — J920 Pleural plaque with presence of asbestos: Secondary | ICD-10-CM

## 2017-09-14 DIAGNOSIS — R0602 Shortness of breath: Secondary | ICD-10-CM | POA: Diagnosis not present

## 2017-09-14 DIAGNOSIS — J449 Chronic obstructive pulmonary disease, unspecified: Secondary | ICD-10-CM | POA: Diagnosis not present

## 2017-09-14 HISTORY — DX: Chronic obstructive pulmonary disease, unspecified: J44.9

## 2017-09-14 HISTORY — DX: Pleural plaque with presence of asbestos: J92.0

## 2017-09-14 LAB — PULMONARY FUNCTION TEST
DL/VA % pred: 94 %
DL/VA: 4.41 ml/min/mmHg/L
DLCO COR % PRED: 65 %
DLCO UNC % PRED: 65 %
DLCO UNC: 21.97 ml/min/mmHg
DLCO cor: 22.03 ml/min/mmHg
FEF 25-75 POST: 1.46 L/s
FEF 25-75 PRE: 0.84 L/s
FEF2575-%Change-Post: 73 %
FEF2575-%PRED-POST: 63 %
FEF2575-%PRED-PRE: 36 %
FEV1-%CHANGE-POST: 22 %
FEV1-%PRED-POST: 66 %
FEV1-%Pred-Pre: 53 %
FEV1-POST: 2.1 L
FEV1-Pre: 1.71 L
FEV1FVC-%Change-Post: 6 %
FEV1FVC-%PRED-PRE: 79 %
FEV6-%CHANGE-POST: 11 %
FEV6-%PRED-POST: 77 %
FEV6-%Pred-Pre: 69 %
FEV6-Post: 3.2 L
FEV6-Pre: 2.86 L
FEV6FVC-%CHANGE-POST: -2 %
FEV6FVC-%Pred-Post: 100 %
FEV6FVC-%Pred-Pre: 103 %
FVC-%Change-Post: 14 %
FVC-%Pred-Post: 76 %
FVC-%Pred-Pre: 66 %
FVC-Post: 3.38 L
FVC-Pre: 2.94 L
POST FEV1/FVC RATIO: 62 %
PRE FEV1/FVC RATIO: 58 %
Post FEV6/FVC ratio: 95 %
Pre FEV6/FVC Ratio: 97 %
RV % pred: 119 %
RV: 3.12 L
TLC % pred: 87 %
TLC: 6.34 L

## 2017-09-14 LAB — NITRIC OXIDE: NITRIC OXIDE: 58

## 2017-09-14 MED ORDER — FLUTICASONE-UMECLIDIN-VILANT 100-62.5-25 MCG/INH IN AEPB: 1.0000 | INHALATION_SPRAY | Freq: Every day | RESPIRATORY_TRACT | 6 refills | Status: DC

## 2017-09-14 MED ORDER — FLUTICASONE-UMECLIDIN-VILANT 100-62.5-25 MCG/INH IN AEPB: 1.0000 | INHALATION_SPRAY | Freq: Every day | RESPIRATORY_TRACT | 0 refills | Status: AC

## 2017-09-14 NOTE — Progress Notes (Signed)
PFT completed today 09/14/17  

## 2017-09-14 NOTE — Progress Notes (Signed)
John Wiggins    [id number]    04-20-1941  Primary Care Physician:Schultz, Lora Havens, MD  Referring Physician: Nicoletta Dress, MD [name], [place] [address]  Chief complaint: Follow-up for COPD, asthma, asbestos exposure  HPI: 77 year old with history of hypertension, hyperlipidemia, stroke, allergies, asbestos exposure, COPD.  He has complaints of chronic dyspnea on exertion and chronic bronchitis with productive cough, white mucus, wheezing, dyspnea for the past several years.  Denies dyspnea at rest.  He is currently on albuterol inhaler which he uses up to 3 times a day, which helps with his breathing. History noted for significant asbestos exposure in the past and chest imaging showing extensive bilateral calcified pleural plaques.  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 Smoking history: 60-pack-year smoking history.  Continues to smoke 1 pack/day Travel History: Lived in [place] all his life.  No significant travel.  Interim history: Started on bevespi and reports improving dyspnea, cough.  He is currently on Chantix and is cutting down the number of cigarettes with intention to quit completely.  Outpatient Encounter Medications as of 09/14/2017  Medication Sig  . albuterol (PROVENTIL) (2.5 MG/3ML) 0.083% nebulizer solution Take 2.5 mg by nebulization every 4 (four) hours as needed for wheezing or shortness of breath.  Marland Kitchen aspirin EC 81 MG tablet Take 81 mg by mouth daily.  Marland Kitchen atorvastatin (LIPITOR) 80 MG tablet Take 80 mg by mouth daily.   Marland Kitchen azelastine (ASTELIN) 0.1 % nasal spray Place 2 sprays into both nostrils 2 (two) times daily as needed for rhinitis or allergies. Use in each nostril as directed   . Glycopyrrolate-Formoterol (BEVESPI AEROSPHERE) 9-4.8 MCG/ACT AERO Inhale 2 puffs into the lungs  2 (two) times daily.  Marland Kitchen losartan (COZAAR) 25 MG tablet TAKE 1 TABLET(25 MG) BY MOUTH DAILY  . PROAIR HFA 108 (90 Base) MCG/ACT inhaler Inhale 1-2 puffs into the lungs every 4 (four) hours as needed for wheezing or shortness of breath.   . varenicline (CHANTIX PAK) 0.5 MG X 11 & 1 MG X 42 tablet Take one 0.5 mg tablet by mouth once daily for 3 days, then increase to one 0.5 mg tablet twice daily for 4 days, then increase to one 1 mg tablet twice daily.  . [DISCONTINUED] Nicotine 21-14-7 MG/24HR KIT Use as directed   No facility-administered encounter medications on file as of 09/14/2017.     Allergies as of 09/14/2017  . (No Known Allergies)    Past Medical History:  Diagnosis Date  . Allergy   . Asthma   . Carotid artery disease ([place])    a. Carotid US 8/17: RICA 50-69% // b. Neck CTA 8/17: pRICA 35%  . COPD (chronic obstructive pulmonary disease) ([place])   . History of echocardiogram    a. Echo 02/15/16 [facility]): borderline LVH, EF 60-65%, impaired relaxation, mild LAE, mild TR, mild RAE, borderline elevation of pulmonary artery pressure  . History of TIA (transient ischemic attack)    02/2016; L arm weakness  . Hyperlipidemia   . Hypertension   . 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 EYE SURGERY     cataracts  . SKIN GRAFT     right finger  .  TONSILLECTOMY      Family History  Problem Relation Age of Onset  . Hypertension Mother   . Heart disease Father        CAD; s/p CABG  . Atrial fibrillation Brother     Social History   Socioeconomic History  . Marital status: Married    Spouse name: Not on file  . Number of children: Not on file  . Years of education: Not on file  . Highest education level: Not on file  Social Needs  . Financial resource strain: Not on file  . Food insecurity - worry: Not on file  . Food insecurity - inability: Not on file  .  Transportation needs - medical: Not on file  . Transportation needs - non-medical: Not on file  Occupational History  . Not on file  Tobacco Use  . Smoking status: Current Some Day Smoker    Packs/day: 0.25    Years: 60.00    Pack years: 15.00    Types: Cigarettes    Last attempt to quit: 02/28/2016    Years since quitting: 1.5  . Smokeless tobacco: Never Used  . Tobacco comment: 2 or 3 cigs a day, on chantix  Substance and Sexual Activity  . Alcohol use: No    Comment: social  . Drug use: No  . Sexual activity: Not on file  Other Topics Concern  . Not on file  Social History Narrative   Retired   Worked maintenance for [employer]   Prior worked with [employer]   Wife - Inez Catalina (married in 2014)   2 sons   2 grandsons   Lives in [place], [place]    Review of systems: Review of Systems  Constitutional: Negative for fever and chills.  HENT: Negative.   Eyes: Negative for blurred vision.  Respiratory: as per HPI  Cardiovascular: Negative for chest pain and palpitations.  Gastrointestinal: Negative for vomiting, diarrhea, blood per rectum. Genitourinary: Negative for dysuria, urgency, frequency and hematuria.  Musculoskeletal: Negative for myalgias, back pain and joint pain.  Skin: Negative for itching and rash.  Neurological: Negative for dizziness, tremors, focal weakness, seizures and loss of consciousness.  Endo/Heme/Allergies: Negative for environmental allergies.  Psychiatric/Behavioral: Negative for depression, suicidal ideas and hallucinations.  All other systems reviewed and are negative.  Physical Exam: Blood pressure 126/62, pulse 66, height '5\' 11"'  (1.803 m), weight 169 lb 6.4 oz (76.8 kg), SpO2 96 %. Gen:      No acute distress HEENT:  EOMI, sclera anicteric Neck:     No masses; no thyromegaly Lungs:    Clear to auscultation bilaterally; normal respiratory effort CV:         Regular rate and rhythm; no murmurs Abd:      + bowel sounds; soft,  non-tender; no palpable masses, no distension Ext:    No edema; adequate peripheral perfusion Skin:      Warm and dry; no rash Neuro: alert and oriented x 3 Psych: normal mood and affect  Data Reviewed: Chest x-ray 05/21/17-calcified bilateral pleural plaques.  Stable compared to prior imaging.   CT chest 08/18/17-extensive calcified bilateral pleural plaques.  Mild emphysema.  No pulmonary fibrosis I have reviewed the images personally.  Alpha-1 antitrypsin 08/05/17-214, PI MM  CBC 08/05/17-WBC 13.3, eos 4.6%, absolute eosinophil count  600 Blood relative profile 08/05/17-IgE 120, RAST panel negative  FENO 09/14/17-58  PFTs 09/14/17 FVC 3.38 [76%], FEV1 2.10 [66%], F/F 62, TLC 87%, DLCO 65% Moderate obstruction with bronchodilator response,  mild diffusion defect.  Assessment:  Pleural plaques, asbestos exposure. CT shows pleural plaques with no pulmonary fibrosis.  We will continue to monitor this.   COPD PFTs reviewed which shows moderate obstruction Is doing well on bevespi however given elevated eosinophils, FENO and bronchodilator response he may do better on inhaled steroids. We will switch him to trelegy inhaler..  Current smoker Currently on Chantix.  He is working on reducing the number of cigarettes.  Plan/Recommendations: - Stop bevespi. Start trelegy. - Continue Chantix, smoking cessation.  Marshell Garfinkel MD [facility] Pager [phone] 09/14/2017, 10:34 AM  CC: Nicoletta Dress, MD.p

## 2017-09-14 NOTE — Patient Instructions (Signed)
We will stop the bevespi inhaler and start you on another inhaler called trelegy.  I believe your breathing will be better on this Your CT scan shows evidence of prior asbestos exposure Please continue to work on quitting smoking Follow-up in 3 months.

## 2017-10-05 DIAGNOSIS — Z720 Tobacco use: Secondary | ICD-10-CM | POA: Diagnosis not present

## 2017-10-05 DIAGNOSIS — Z6826 Body mass index (BMI) 26.0-26.9, adult: Secondary | ICD-10-CM | POA: Diagnosis not present

## 2017-10-05 DIAGNOSIS — J449 Chronic obstructive pulmonary disease, unspecified: Secondary | ICD-10-CM | POA: Diagnosis not present

## 2017-10-05 DIAGNOSIS — I10 Essential (primary) hypertension: Secondary | ICD-10-CM | POA: Diagnosis not present

## 2017-10-05 DIAGNOSIS — Z87891 Personal history of nicotine dependence: Secondary | ICD-10-CM | POA: Diagnosis not present

## 2017-10-05 DIAGNOSIS — E1169 Type 2 diabetes mellitus with other specified complication: Secondary | ICD-10-CM | POA: Diagnosis not present

## 2017-10-05 DIAGNOSIS — I639 Cerebral infarction, unspecified: Secondary | ICD-10-CM | POA: Diagnosis not present

## 2017-10-05 DIAGNOSIS — E785 Hyperlipidemia, unspecified: Secondary | ICD-10-CM | POA: Diagnosis not present

## 2017-12-20 ENCOUNTER — Ambulatory Visit: Payer: Medicare Other | Admitting: Pulmonary Disease

## 2018-01-19 DIAGNOSIS — E1169 Type 2 diabetes mellitus with other specified complication: Secondary | ICD-10-CM | POA: Diagnosis not present

## 2018-01-19 DIAGNOSIS — J449 Chronic obstructive pulmonary disease, unspecified: Secondary | ICD-10-CM | POA: Diagnosis not present

## 2018-01-19 DIAGNOSIS — Z720 Tobacco use: Secondary | ICD-10-CM | POA: Diagnosis not present

## 2018-01-19 DIAGNOSIS — I10 Essential (primary) hypertension: Secondary | ICD-10-CM | POA: Diagnosis not present

## 2018-01-19 DIAGNOSIS — E785 Hyperlipidemia, unspecified: Secondary | ICD-10-CM | POA: Diagnosis not present

## 2018-01-19 DIAGNOSIS — Z1339 Encounter for screening examination for other mental health and behavioral disorders: Secondary | ICD-10-CM | POA: Diagnosis not present

## 2018-01-19 DIAGNOSIS — I639 Cerebral infarction, unspecified: Secondary | ICD-10-CM | POA: Diagnosis not present

## 2018-01-19 DIAGNOSIS — E663 Overweight: Secondary | ICD-10-CM | POA: Diagnosis not present

## 2018-02-07 DIAGNOSIS — H35372 Puckering of macula, left eye: Secondary | ICD-10-CM | POA: Diagnosis not present

## 2018-04-25 DIAGNOSIS — E785 Hyperlipidemia, unspecified: Secondary | ICD-10-CM | POA: Diagnosis not present

## 2018-04-25 DIAGNOSIS — Z1212 Encounter for screening for malignant neoplasm of rectum: Secondary | ICD-10-CM | POA: Diagnosis not present

## 2018-04-25 DIAGNOSIS — I639 Cerebral infarction, unspecified: Secondary | ICD-10-CM | POA: Diagnosis not present

## 2018-04-25 DIAGNOSIS — J449 Chronic obstructive pulmonary disease, unspecified: Secondary | ICD-10-CM | POA: Diagnosis not present

## 2018-04-25 DIAGNOSIS — Z23 Encounter for immunization: Secondary | ICD-10-CM | POA: Diagnosis not present

## 2018-04-25 DIAGNOSIS — E1169 Type 2 diabetes mellitus with other specified complication: Secondary | ICD-10-CM | POA: Diagnosis not present

## 2018-04-25 DIAGNOSIS — I10 Essential (primary) hypertension: Secondary | ICD-10-CM | POA: Diagnosis not present

## 2018-04-25 DIAGNOSIS — Z125 Encounter for screening for malignant neoplasm of prostate: Secondary | ICD-10-CM | POA: Diagnosis not present

## 2018-05-08 ENCOUNTER — Other Ambulatory Visit: Payer: Self-pay | Admitting: Pulmonary Disease

## 2018-06-18 DIAGNOSIS — J449 Chronic obstructive pulmonary disease, unspecified: Secondary | ICD-10-CM | POA: Diagnosis not present

## 2018-06-18 DIAGNOSIS — Z6824 Body mass index (BMI) 24.0-24.9, adult: Secondary | ICD-10-CM | POA: Diagnosis not present

## 2018-07-27 DIAGNOSIS — I639 Cerebral infarction, unspecified: Secondary | ICD-10-CM | POA: Diagnosis not present

## 2018-07-27 DIAGNOSIS — J449 Chronic obstructive pulmonary disease, unspecified: Secondary | ICD-10-CM | POA: Diagnosis not present

## 2018-07-27 DIAGNOSIS — Z9181 History of falling: Secondary | ICD-10-CM | POA: Diagnosis not present

## 2018-07-27 DIAGNOSIS — E785 Hyperlipidemia, unspecified: Secondary | ICD-10-CM | POA: Diagnosis not present

## 2018-07-27 DIAGNOSIS — Z1331 Encounter for screening for depression: Secondary | ICD-10-CM | POA: Diagnosis not present

## 2018-07-27 DIAGNOSIS — E1169 Type 2 diabetes mellitus with other specified complication: Secondary | ICD-10-CM | POA: Diagnosis not present

## 2018-07-27 DIAGNOSIS — I10 Essential (primary) hypertension: Secondary | ICD-10-CM | POA: Diagnosis not present

## 2018-09-21 ENCOUNTER — Other Ambulatory Visit: Payer: Self-pay

## 2018-09-21 DIAGNOSIS — I6523 Occlusion and stenosis of bilateral carotid arteries: Secondary | ICD-10-CM

## 2018-09-22 ENCOUNTER — Encounter (HOSPITAL_COMMUNITY): Payer: Medicare Other

## 2018-09-22 ENCOUNTER — Ambulatory Visit: Payer: Medicare Other | Admitting: Family

## 2018-10-31 DIAGNOSIS — E785 Hyperlipidemia, unspecified: Secondary | ICD-10-CM | POA: Diagnosis not present

## 2018-10-31 DIAGNOSIS — I6990 Unspecified sequelae of unspecified cerebrovascular disease: Secondary | ICD-10-CM | POA: Diagnosis not present

## 2018-10-31 DIAGNOSIS — E1169 Type 2 diabetes mellitus with other specified complication: Secondary | ICD-10-CM | POA: Diagnosis not present

## 2018-10-31 DIAGNOSIS — J449 Chronic obstructive pulmonary disease, unspecified: Secondary | ICD-10-CM | POA: Diagnosis not present

## 2018-10-31 DIAGNOSIS — Z87891 Personal history of nicotine dependence: Secondary | ICD-10-CM | POA: Diagnosis not present

## 2018-10-31 DIAGNOSIS — I10 Essential (primary) hypertension: Secondary | ICD-10-CM | POA: Diagnosis not present

## 2018-11-25 ENCOUNTER — Telehealth (HOSPITAL_COMMUNITY): Payer: Self-pay | Admitting: Rehabilitation

## 2018-11-25 NOTE — Telephone Encounter (Signed)
The above patient or their representative was contacted and gave the following answers to these questions:         Do you have any of the following symptoms? No  Fever                    Cough                   Shortness of breath  Do  you have any of the following other symptoms?    muscle pain         vomiting,        diarrhea        rash         weakness        red eye        abdominal pain         bruising          bruising or bleeding              joint pain           severe headache    Have you been in contact with someone who was or has been sick in the past 2 weeks? No  Yes                 Unsure                         Unable to assess   Does the person that you were in contact with have any of the following symptoms?   Cough         shortness of breath           muscle pain         vomiting,            diarrhea            rash            weakness           fever            red eye           abdominal pain           bruising  or  bleeding                joint pain                severe headache               Have you  or someone you have been in contact with traveled internationally in th last month?  No        If yes, which countries?   Have you  or someone you have been in contact with traveled outside [place] in th last month? No         If yes, which state and city?   COMMENTS OR ACTION PLAN FOR THIS PATIENT:          

## 2018-11-29 ENCOUNTER — Ambulatory Visit (HOSPITAL_COMMUNITY)
Admission: RE | Admit: 2018-11-29 | Discharge: 2018-11-29 | Disposition: A | Discharge status: Home / Self Care | Payer: Medicare HMO | Source: Ambulatory Visit | Attending: Family | Admitting: Family

## 2018-11-29 ENCOUNTER — Other Ambulatory Visit: Payer: Self-pay

## 2018-11-29 DIAGNOSIS — I6523 Occlusion and stenosis of bilateral carotid arteries: Secondary | ICD-10-CM

## 2018-12-01 ENCOUNTER — Encounter: Payer: Self-pay | Admitting: Family

## 2018-12-01 ENCOUNTER — Ambulatory Visit (INDEPENDENT_AMBULATORY_CARE_PROVIDER_SITE_OTHER): Payer: Medicare HMO | Admitting: Family

## 2018-12-01 ENCOUNTER — Telehealth: Payer: Self-pay | Admitting: Family

## 2018-12-01 ENCOUNTER — Other Ambulatory Visit: Payer: Self-pay

## 2018-12-01 DIAGNOSIS — Z8673 Personal history of transient ischemic attack (TIA), and cerebral infarction without residual deficits: Secondary | ICD-10-CM

## 2018-12-01 DIAGNOSIS — F17200 Nicotine dependence, unspecified, uncomplicated: Secondary | ICD-10-CM

## 2018-12-01 DIAGNOSIS — I6523 Occlusion and stenosis of bilateral carotid arteries: Secondary | ICD-10-CM

## 2018-12-01 DIAGNOSIS — R69 Illness, unspecified: Secondary | ICD-10-CM | POA: Diagnosis not present

## 2018-12-01 NOTE — Progress Notes (Signed)
Virtual Visit via Telephone Note  I connected with John Wiggins on 12/01/2018 using the D[employer] by telephone and verified that I was speaking with the correct person using two identifiers. Patient was located at [phone],  [place], [place], [place] and accompanied by himself. I am located at [facility] office on [address], [place], [place].    The limitations of evaluation and management by telemedicine and the availability of in person appointments have been previously discussed with the patient and are documented in the patients chart. The patient expressed understanding and consented to proceed.  PCP: Nicoletta Dress, MD Chief Complaint: Follow up Extracranial carotid artery stenosis    History of Present Illness  John Wiggins is a 78 y.o. male who presented to [facility] with symptoms of left upper extremity weakness that came on suddenly back in Aug. 2017 that lasted 2-3 hours. He had no previous history of stroke amaurosis or TIA. MRI and CT scan during that hospitalization demonstrated no strokes and he was diagnosed with TIA. He had ultrasound which demonstrated to 69% stenosis at [facility].  He has not had any further symptoms and he has been taking aspirin and statin as prescribed that time.  He otherwise has a history of COPD for which he takes inhalers only.  His activity is limited by dyspnea after a couple of hours; if he has to bend over, then about a half hour.   He also has asbestosis, was a pipe fitter and applied asbestos insulation.  He has no chest pain palpitations shortness of breath is not on blood thinners and has no limitations to his walking other than shortness of breath.  He was scheduled for right CEA which was cancelled after Dr. Donzetta Matters reviewed the CTA.He has a less than 40% stenosis of his right side which was symptomatic side.  He reports no weakness reoccurrence in his extremities, no amorous fugax, and no  difficulties with speech or swallowing.   He denies claudication sx's with walking.   Diabetic: borderline Tobacco use: smoker  (1/2 ppd/day, started at age 68 yrs) Has tried Chantix, had taken it before and resumed smoking after 3 months.   Pt meds include: Statin : yes ASA: yes Other anticoagulants/antiplatelets: no   Past Medical History:  Diagnosis Date  . Allergy   . Asthma   . Carotid artery disease ([place])    a. Carotid US 8/17: RICA 50-69% // b. Neck CTA 8/17: pRICA 35%  . COPD (chronic obstructive pulmonary disease) ([place])   . History of echocardiogram    a. Echo 02/15/16 [facility]): borderline LVH, EF 60-65%, impaired relaxation, mild LAE, mild TR, mild RAE, borderline elevation of pulmonary artery pressure  . History of TIA (transient ischemic attack)    02/2016; L arm weakness  . Hyperlipidemia   . Hypertension   . Mesothelioma of both lungs [facility])    dx age 56; worked in asbestos; followed by PCP    Social History Social History   Tobacco Use  . Smoking status: Current Some Day Smoker    Packs/day: 0.25    Years: 60.00    Pack years: 15.00    Types: Cigarettes    Last attempt to quit: 02/28/2016    Years since quitting: 2.7  . Smokeless tobacco: Never Used  . Tobacco comment: 2 or 3 cigs a day, on chantix  Substance Use Topics  . Alcohol use: No    Comment: social  . Drug use: No  Family History Family History  Problem Relation Age of Onset  . Hypertension Mother   . Heart disease Father        CAD; s/p CABG  . Atrial fibrillation Brother     Surgical History Past Surgical History:  Procedure Laterality Date  . EYE SURGERY     cataracts  . SKIN GRAFT     right finger  . TONSILLECTOMY      No Known Allergies  Current Outpatient Medications  Medication Sig Dispense Refill  . albuterol (PROVENTIL) (2.5 MG/3ML) 0.083% nebulizer solution Take 2.5 mg by nebulization every 4 (four) hours as needed for wheezing or shortness of  breath.    Marland Kitchen aspirin EC 81 MG tablet Take 81 mg by mouth daily.    Marland Kitchen atorvastatin (LIPITOR) 80 MG tablet Take 80 mg by mouth daily.     Marland Kitchen azelastine (ASTELIN) 0.1 % nasal spray Place 2 sprays into both nostrils 2 (two) times daily as needed for rhinitis or allergies. Use in each nostril as directed     . losartan (COZAAR) 25 MG tablet TAKE 1 TABLET(25 MG) BY MOUTH DAILY 90 tablet 2  . PROAIR HFA 108 (90 Base) MCG/ACT inhaler Inhale 1-2 puffs into the lungs every 4 (four) hours as needed for wheezing or shortness of breath.     . TRELEGY ELLIPTA 100-62.5-25 MCG/INH AEPB INHALE 1 PUFF INTO THE LUNGS DAILY 60 each 0  . varenicline (CHANTIX PAK) 0.5 MG X 11 & 1 MG X 42 tablet Take one 0.5 mg tablet by mouth once daily for 3 days, then increase to one 0.5 mg tablet twice daily for 4 days, then increase to one 1 mg tablet twice daily. 53 tablet 0   No current facility-administered medications for this visit.      REVIEW OF SYSTEMS: Cardiovascular: No chest pain, chest pressure, palpitations, orthopnea, or+ dyspnea on exertion. No claudication or rest pain,  No history of DVT or phlebitis. Pulmonary: No productive cough, asthma, occasional wheezing. Neurologic: No weakness, paresthesias, aphasia, or amaurosis. No dizziness. Hematologic: No bleeding problems or clotting disorders. Musculoskeletal: No joint pain or joint swelling. Gastrointestinal: No blood in stool or hematemesis Genitourinary: No dysuria or hematuria. Psychiatric:: No history of major depression. Integumentary: No rashes or ulcers. Constitutional: No fever or chills.    Past Medical History:  Diagnosis Date  . Allergy   . Asthma   . Carotid artery disease ([place])    a. Carotid US 8/17: RICA 50-69% // b. Neck CTA 8/17: pRICA 35%  . COPD (chronic obstructive pulmonary disease) ([place])   . History of echocardiogram    a. Echo 02/15/16 [facility]): borderline LVH, EF 60-65%, impaired relaxation, mild LAE, mild TR, mild  RAE, borderline elevation of pulmonary artery pressure  . History of TIA (transient ischemic attack)    02/2016; L arm weakness  . Hyperlipidemia   . Hypertension   . Mesothelioma of both lungs [facility])    dx age 14; worked in asbestos; followed by PCP    Past Surgical History:  Procedure Laterality Date  . EYE SURGERY     cataracts  . SKIN GRAFT     right finger  . TONSILLECTOMY      No outpatient medications have been marked as taking for the 12/01/18 encounter (Appointment) with Joy Reiger, Sharmon Leyden, NP.    DATA  Carotid Duplex (11-29-18): Right Carotid Findings  +----------+--------+--------+--------+------------+--------+           PSV cm/sEDV cm/sStenosisDescribe  Comments +----------+--------+--------+--------+------------+--------+ CCA Prox  140     12                                   +----------+--------+--------+--------+------------+--------+ CCA Mid   103     11                                   +----------+--------+--------+--------+------------+--------+ CCA Distal72      13                                   +----------+--------+--------+--------+------------+--------+ ICA Prox  81      16      1-39%   heterogenous         +----------+--------+--------+--------+------------+--------+ ICA Mid   102     18                                   +----------+--------+--------+--------+------------+--------+ ICA Distal58      13                                   +----------+--------+--------+--------+------------+--------+ ECA       99      9                                    +----------+--------+--------+--------+------------+--------+  +----------+--------+-------+----------------+-------------------+           PSV cm/sEDV cmsDescribe        Arm Pressure (mmHG) +----------+--------+-------+----------------+-------------------+ Subclavian214            Multiphasic, WNL                     +----------+--------+-------+----------------+-------------------+  +---------+--------+--+--------+---------+ VertebralPSV cm/s63EDV cm/sAntegrade +---------+--------+--+--------+---------+    Left Carotid Findings: +----------+--------+--------+--------+--------+--------------------+           PSV cm/sEDV cm/sStenosisDescribeComments             +----------+--------+--------+--------+--------+--------------------+ CCA Prox  113     21                                           +----------+--------+--------+--------+--------+--------------------+ CCA Mid   104     14                                           +----------+--------+--------+--------+--------+--------------------+ CCA Distal84      13                                           +----------+--------+--------+--------+--------+--------------------+ ICA Prox  85      20                      minimal plaque noted +----------+--------+--------+--------+--------+--------------------+ ICA Mid   105  18                                           +----------+--------+--------+--------+--------+--------------------+ ICA Distal83      24                                           +----------+--------+--------+--------+--------+--------------------+ ECA       159     14                                           +----------+--------+--------+--------+--------+--------------------+  +----------+--------+--------+----------------+-------------------+ SubclavianPSV cm/sEDV cm/sDescribe        Arm Pressure (mmHG) +----------+--------+--------+----------------+-------------------+           120             Multiphasic, WNL                    +----------+--------+--------+----------------+-------------------+  +---------+--------+--+--------+---------+ VertebralPSV cm/s71EDV cm/sAntegrade +---------+--------+--+--------+---------+  Summary: Right  Carotid: Velocities in the right ICA are consistent with a 1-39% stenosis.                No significant changes noted when compared to previous exam. Left Carotid: Velocities in the left ICA are consistent with a 1-39% stenosis,               low end of range. Vertebrals:  Bilateral vertebral arteries demonstrate antegrade flow. Subclavians: Normal flow hemodynamics were seen in bilateral subclavian              arteries    Observations/Objective: No subsequent stroke or TIA since the 2017 TIA.  Carotid duplex on 11-29-18 demonstrated minimal stenosis of the bilateral extracranial carotid arteries.   His atherosclerotic risk factors include smoking since the age of 48 years until the present, COPD, and dyslipidemia.   Assessment and Plan: Malakie Balis is a 78 y.o. male who had a TIA in August 2017 as manifested by left arm weakness that lasted 2-3 hours; no subsequent stroke or TIA symptoms.    He takes a daily statin and ASA.    Follow Up Instructions:   Follow up 1 year with carotid duplex.   Continue daily 81 mg ASA and a statin.  Over 3 minutes was spent counseling patient re smoking cessation, and patient was given  1-800 QUITNOWbresource re smoking cessation. I also advised him to speak with his PCP to help determine if another medication would be helpful for him re smoking cessation. He has tried Chantix at least twice, has increased his smoking to 1/2 ppd.    I discussed in depth with the patient the nature of atherosclerosis, and emphasized the importance of maximal medical management including strict control of blood pressure, blood glucose, and lipid levels, obtaining regular exercise, and cessation of smoking.  The patient is aware that without maximal medical management the underlying atherosclerotic disease process will progress, limiting the benefit of any interventions. The patient was given information about stroke prevention and what symptoms should prompt the  patient to seek immediate medical care. Thank you for allowing [place] to participate in this patient's care.    I discussed the assessment and treatment plan  with the patient. The patient was provided an opportunity to ask questions and all were answered. The patient agreed with the plan and demonstrated an understanding of the instructions.    The patient was advised to call back or seek an in-person evaluation if the symptoms worsen or if the condition fails to improve as anticipated.  I spent 15 minutes with the patient via telephone encounter.   Gabrielle Dare Taiesha Bovard [facility] Office: [phone]  12/01/2018, 2:20 PM

## 2018-12-01 NOTE — Telephone Encounter (Signed)
Virtual Visit Pre-Appointment Phone Call  Today, I spoke with John Wiggins and performed the following actions:  1. I explained that we are currently trying to limit exposure to the COVID-19 virus by seeing patients at home rather than in the office.  I explained that the visits are best done by video, but can be done by telephone. John Wiggins requested a telephone call because he does not have access to a video connection through his cell phone or a family member's. I asked the patient if a virtual visit that the patient would like to try instead of coming into the office. John Wiggins agreed to proceed with the virtual visit scheduled with John Chambers, NP on 5.28.20.    2. I confirmed the BEST phone number to call the day of the visit and- This is included in appointment notes.  3. I confirmed consent verbally as listed below. i. This visit is being performed in the setting of COVID-19. ii. All virtual visits are billed to your insurance company just like a normal visit would be.   iii. We'd like you to understand that the technology does not allow for your provider to perform an examination, and thus may limit your provider's ability to fully assess your condition.  iv. If your provider identifies any concerns that need to be evaluated in person, we will make arrangements to do so.   v. Finally, though the technology is pretty good, we cannot assure that it will always work on either your or our end, and in the setting of a video visit, we may have to convert it to a phone-only visit.  In either situation, we cannot ensure that we have a secure connection.   vi. Are you willing to proceed?"  STAFF: Did the patient verbally acknowledge consent to telehealth visit? Document YES/NO here: YES  2. I advised the patient to be prepared - I asked that the patient, on the day of his visit, record any information possible with the equipment at his home, such as blood pressure, pulse,  oxygen saturation, and your weight and write them all down. I asked the patient to have a pen and paper handy nearby the day of the visit as well.  3. I Informed patient they will receive a phone call 15 minutes prior to their appointment time from a [place] or nurse to review medications, allergies, etc. to prepare for the visit.    TELEPHONE CALL NOTE  John Wiggins has been deemed a candidate for a follow-up tele-health visit to limit community exposure during the Covid-19 pandemic. I spoke with the patient via phone to ensure availability of phone/video source, confirm preferred email & phone number, and discuss instructions and expectations.  I reminded John Wiggins to be prepared with any vital sign and/or heart rhythm information that could potentially be obtained via home monitoring, at the time of his visit. I reminded John Wiggins to expect a phone call prior to his visit.  John Wiggins 12/01/2018 12:45 PM     FULL LENGTH CONSENT FOR TELE-HEALTH VISIT   I hereby voluntarily request, consent and authorize [facility] and its employed or contracted physicians, physician assistants, nurse practitioners or other licensed health care professionals (the Practitioner), to provide me with telemedicine health care services (the Services") as deemed necessary by the treating Practitioner. I acknowledge and consent to receive the Services by the Practitioner via telemedicine. I understand that the telemedicine visit will involve communicating with the Practitioner through  live Emergency planning/management officer and the disclosure of certain medical information by electronic transmission. I acknowledge that I have been given the opportunity to request an in-person assessment or other available alternative prior to the telemedicine visit and am voluntarily participating in the telemedicine visit.  I understand that I have the right to withhold or withdraw my consent to the use  of telemedicine in the course of my care at any time, without affecting my right to future care or treatment, and that the Practitioner or I may terminate the telemedicine visit at any time. I understand that I have the right to inspect all information obtained and/or recorded in the course of the telemedicine visit and may receive copies of available information for a reasonable fee.  I understand that some of the potential risks of receiving the Services via telemedicine include:   Delay or interruption in medical evaluation due to technological equipment failure or disruption;  Information transmitted may not be sufficient (e.g. poor resolution of images) to allow for appropriate medical decision making by the Practitioner; and/or   In rare instances, security protocols could fail, causing a breach of personal health information.  Furthermore, I acknowledge that it is my responsibility to provide information about my medical history, conditions and care that is complete and accurate to the best of my ability. I acknowledge that Practitioner's advice, recommendations, and/or decision may be based on factors not within their control, such as incomplete or inaccurate data provided by me or distortions of diagnostic images or specimens that may result from electronic transmissions. I understand that the practice of medicine is not an exact science and that Practitioner makes no warranties or guarantees regarding treatment outcomes. I acknowledge that I will receive a copy of this consent concurrently upon execution via email to the email address I last provided but may also request a printed copy by calling the office of [place].    I understand that my insurance will be billed for this visit.   I have read or had this consent read to me.  I understand the contents of this consent, which adequately explains the benefits and risks of the Services being provided via telemedicine.   I have been  provided ample opportunity to ask questions regarding this consent and the Services and have had my questions answered to my satisfaction.  I give my informed consent for the services to be provided through the use of telemedicine in my medical care  By participating in this telemedicine visit I agree to the above.

## 2018-12-01 NOTE — Patient Instructions (Signed)
Steps to Quit Smoking  Smoking tobacco can be bad for your health. It can also affect almost every organ in your body. Smoking puts you and people around you at risk for many serious long-lasting (chronic) diseases. Quitting smoking is hard, but it is one of the best things that you can do for your health. It is never too late to quit. What are the benefits of quitting smoking? When you quit smoking, you lower your risk for getting serious diseases and conditions. They can include:  Lung cancer or lung disease.  Heart disease.  Stroke.  Heart attack.  Not being able to have children (infertility).  Weak bones (osteoporosis) and broken bones (fractures). If you have coughing, wheezing, and shortness of breath, those symptoms may get better when you quit. You may also get sick less often. If you are pregnant, quitting smoking can help to lower your chances of having a baby of low birth weight. What can I do to help me quit smoking? Talk with your doctor about what can help you quit smoking. Some things you can do (strategies) include:  Quitting smoking totally, instead of slowly cutting back how much you smoke over a period of time.  Going to in-person counseling. You are more likely to quit if you go to many counseling sessions.  Using resources and support systems, such as: ? Online chats with a counselor. ? Phone quitlines. ? Printed self-help materials. ? Support groups or group counseling. ? Text messaging programs. ? Mobile phone apps or applications.  Taking medicines. Some of these medicines may have nicotine in them. If you are pregnant or breastfeeding, do not take any medicines to quit smoking unless your doctor says it is okay. Talk with your doctor about counseling or other things that can help you. Talk with your doctor about using more than one strategy at the same time, such as taking medicines while you are also going to in-person counseling. This can help make  quitting easier. What things can I do to make it easier to quit? Quitting smoking might feel very hard at first, but there is a lot that you can do to make it easier. Take these steps:  Talk to your family and friends. Ask them to support and encourage you.  Call phone quitlines, reach out to support groups, or work with a counselor.  Ask people who smoke to not smoke around you.  Avoid places that make you want (trigger) to smoke, such as: ? Bars. ? Parties. ? Smoke-break areas at work.  Spend time with people who do not smoke.  Lower the stress in your life. Stress can make you want to smoke. Try these things to help your stress: ? Getting regular exercise. ? Deep-breathing exercises. ? Yoga. ? Meditating. ? Doing a body scan. To do this, close your eyes, focus on one area of your body at a time from head to toe, and notice which parts of your body are tense. Try to relax the muscles in those areas.  Download or buy apps on your mobile phone or tablet that can help you stick to your quit plan. There are many free apps, such as QuitGuide from the CDC (Centers for Disease Control and Prevention). You can find more support from smokefree.gov and other websites. This information is not intended to replace advice given to you by your health care provider. Make sure you discuss any questions you have with your health care provider. Document Released: 04/18/2009 Document Revised: 02/18/2016   Document Reviewed: 11/06/2014 Elsevier Interactive Patient Education  [employer].     Stroke Prevention Some medical conditions and lifestyle choices can lead to a higher risk for a stroke. You can help to prevent a stroke by making nutrition, lifestyle, and other changes. What nutrition changes can be made?   Eat healthy foods. ? Choose foods that are high in fiber. These include:  Fresh fruits.  Fresh vegetables.  Whole grains. ? Eat at least 5 or more servings of fruits and  vegetables each day. Try to fill half of your plate at each meal with fruits and vegetables. ? Choose lean protein foods. These include:  Lowfat (lean) cuts of meat.  Chicken without skin.  Fish.  Tofu.  Beans.  Nuts. ? Eat low-fat dairy products. ? Avoid foods that:  Are high in salt (sodium).  Have saturated fat.  Have trans fat.  Have cholesterol.  Are processed.  Are premade.  Follow eating guidelines as told by your doctor. These may include: ? Reducing how many calories you eat and drink each day. ? Limiting how much salt you eat or drink each day to 1,500 milligrams (mg). ? Using only healthy fats for cooking. These include:  Olive oil.  Canola oil.  Sunflower oil. ? Counting how many carbohydrates you eat and drink each day. What lifestyle changes can be made?  Try to stay at a healthy weight. Talk to your doctor about what a good weight is for you.  Get at least 30 minutes of moderate physical activity at least 5 days a week. This can include: ? Fast walking. ? Biking. ? Swimming.  Do not use any products that have nicotine or tobacco. This includes cigarettes and e-cigarettes. If you need help quitting, ask your doctor. Avoid being around tobacco smoke in general.  Limit how much alcohol you drink to no more than 1 drink a day for nonpregnant women and 2 drinks a day for men. One drink equals 12 oz of beer, 5 oz of wine, or 1 oz of hard liquor.  Do not use drugs.  Avoid taking birth control pills. Talk to your doctor about the risks of taking birth control pills if: ? You are over 73 years old. ? You smoke. ? You get migraines. ? You have had a blood clot. What other changes can be made?  Manage your cholesterol. ? It is important to eat a healthy diet. ? If your cholesterol cannot be managed through your diet, you may also need to take medicines. Take medicines as told by your doctor.  Manage your diabetes. ? It is important to eat a  healthy diet and to exercise regularly. ? If your blood sugar cannot be managed through diet and exercise, you may need to take medicines. Take medicines as told by your doctor.  Control your high blood pressure (hypertension). ? Try to keep your blood pressure below 130/80. This can help lower your risk of stroke. ? It is important to eat a healthy diet and to exercise regularly. ? If your blood pressure cannot be managed through diet and exercise, you may need to take medicines. Take medicines as told by your doctor. ? Ask your doctor if you should check your blood pressure at home. ? Have your blood pressure checked every year. Do this even if your blood pressure is normal.  Talk to your doctor about getting checked for a sleep disorder. Signs of this can include: ? Snoring a lot. ? Feeling  very tired.  Take over-the-counter and prescription medicines only as told by your doctor. These may include aspirin or blood thinners (antiplatelets or anticoagulants).  Make sure that any other medical conditions you have are managed. Where to find more information  American Stroke Association: www.strokeassociation.org  National Stroke Association: www.stroke.org Get help right away if:  You have any symptoms of stroke. "BE FAST" is an easy way to remember the main warning signs: ? B - Balance. Signs are dizziness, sudden trouble walking, or loss of balance. ? E - Eyes. Signs are trouble seeing or a sudden change in how you see. ? F - Face. Signs are sudden weakness or loss of feeling of the face, or the face or eyelid drooping on one side. ? A - Arms. Signs are weakness or loss of feeling in an arm. This happens suddenly and usually on one side of the body. ? S - Speech. Signs are sudden trouble speaking, slurred speech, or trouble understanding what people say. ? T - Time. Time to call emergency services. Write down what time symptoms started.  You have other signs of stroke, such as: ? A  sudden, very bad headache with no known cause. ? Feeling sick to your stomach (nausea). ? Throwing up (vomiting). ? Jerky movements you cannot control (seizure). These symptoms may represent a serious problem that is an emergency. Do not wait to see if the symptoms will go away. Get medical help right away. Call your local emergency services (911 in the [place] Do not drive yourself to the hospital. Summary  You can prevent a stroke by eating healthy, exercising, not smoking, drinking less alcohol, and treating other health problems, such as diabetes, high blood pressure, or high cholesterol.  Do not use any products that contain nicotine or tobacco, such as cigarettes and e-cigarettes.  Get help right away if you have any signs or symptoms of a stroke. This information is not intended to replace advice given to you by your health care provider. Make sure you discuss any questions you have with your health care provider. Document Released: 12/22/2011 Document Revised: 09/23/2016 Document Reviewed: 09/23/2016 Elsevier Interactive Patient Education  2019 [employer].

## 2019-01-31 DIAGNOSIS — I6990 Unspecified sequelae of unspecified cerebrovascular disease: Secondary | ICD-10-CM | POA: Diagnosis not present

## 2019-01-31 DIAGNOSIS — E1169 Type 2 diabetes mellitus with other specified complication: Secondary | ICD-10-CM | POA: Diagnosis not present

## 2019-01-31 DIAGNOSIS — E785 Hyperlipidemia, unspecified: Secondary | ICD-10-CM | POA: Diagnosis not present

## 2019-01-31 DIAGNOSIS — Z139 Encounter for screening, unspecified: Secondary | ICD-10-CM | POA: Diagnosis not present

## 2019-01-31 DIAGNOSIS — J449 Chronic obstructive pulmonary disease, unspecified: Secondary | ICD-10-CM | POA: Diagnosis not present

## 2019-01-31 DIAGNOSIS — I10 Essential (primary) hypertension: Secondary | ICD-10-CM | POA: Diagnosis not present

## 2019-02-02 DIAGNOSIS — E875 Hyperkalemia: Secondary | ICD-10-CM | POA: Diagnosis not present

## 2019-03-30 DIAGNOSIS — L821 Other seborrheic keratosis: Secondary | ICD-10-CM | POA: Diagnosis not present

## 2019-03-30 DIAGNOSIS — L578 Other skin changes due to chronic exposure to nonionizing radiation: Secondary | ICD-10-CM | POA: Diagnosis not present

## 2019-03-30 DIAGNOSIS — C4441 Basal cell carcinoma of skin of scalp and neck: Secondary | ICD-10-CM | POA: Diagnosis not present

## 2019-03-30 DIAGNOSIS — L814 Other melanin hyperpigmentation: Secondary | ICD-10-CM | POA: Diagnosis not present

## 2019-03-31 DIAGNOSIS — Z23 Encounter for immunization: Secondary | ICD-10-CM | POA: Diagnosis not present

## 2019-04-13 DIAGNOSIS — C4441 Basal cell carcinoma of skin of scalp and neck: Secondary | ICD-10-CM | POA: Diagnosis not present

## 2019-05-03 DIAGNOSIS — J449 Chronic obstructive pulmonary disease, unspecified: Secondary | ICD-10-CM | POA: Diagnosis not present

## 2019-05-03 DIAGNOSIS — I6990 Unspecified sequelae of unspecified cerebrovascular disease: Secondary | ICD-10-CM | POA: Diagnosis not present

## 2019-05-03 DIAGNOSIS — E1169 Type 2 diabetes mellitus with other specified complication: Secondary | ICD-10-CM | POA: Diagnosis not present

## 2019-05-03 DIAGNOSIS — E785 Hyperlipidemia, unspecified: Secondary | ICD-10-CM | POA: Diagnosis not present

## 2019-05-03 DIAGNOSIS — I10 Essential (primary) hypertension: Secondary | ICD-10-CM | POA: Diagnosis not present

## 2019-05-03 DIAGNOSIS — Z125 Encounter for screening for malignant neoplasm of prostate: Secondary | ICD-10-CM | POA: Diagnosis not present

## 2019-05-17 DIAGNOSIS — Z125 Encounter for screening for malignant neoplasm of prostate: Secondary | ICD-10-CM | POA: Diagnosis not present

## 2019-05-17 DIAGNOSIS — E785 Hyperlipidemia, unspecified: Secondary | ICD-10-CM | POA: Diagnosis not present

## 2019-05-17 DIAGNOSIS — Z9181 History of falling: Secondary | ICD-10-CM | POA: Diagnosis not present

## 2019-05-17 DIAGNOSIS — Z0000 Encounter for general adult medical examination without abnormal findings: Secondary | ICD-10-CM | POA: Diagnosis not present

## 2019-05-17 DIAGNOSIS — Z1331 Encounter for screening for depression: Secondary | ICD-10-CM | POA: Diagnosis not present

## 2019-07-21 IMAGING — CT CT CHEST HIGH RESOLUTION W/O CM
2 of 5 series · 15 of 36 positions shown, 18 images · non-contrast
Comparison: None.

CLINICAL DATA: Asbestos exposure.  Dyspnea.  Current smoker.

EXAM:
CT CHEST WITHOUT CONTRAST
TECHNIQUE: Multidetector CT imaging of the chest was performed following the
standard protocol without intravenous contrast. High resolution
imaging of the lungs, as well as inspiratory and expiratory imaging,
was performed.

[Series 2: high resolution · axial · 0.71mm/px · z∈[-378,-32]mm · 12 of 191 slices shown, 15 images]
[im 9/191  mediastinal]
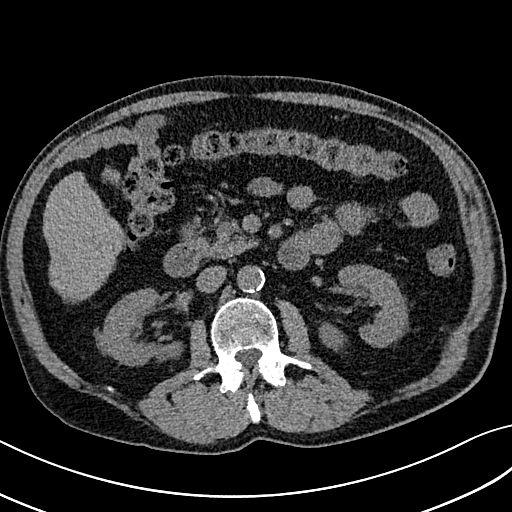
[im 9/191  lung]
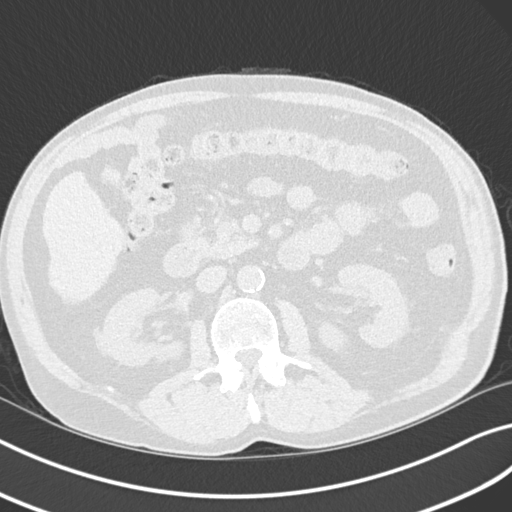
[im 25/191  lung]
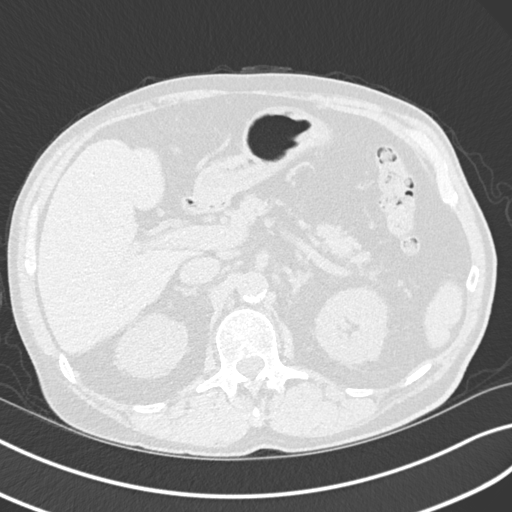
[im 42/191  lung]
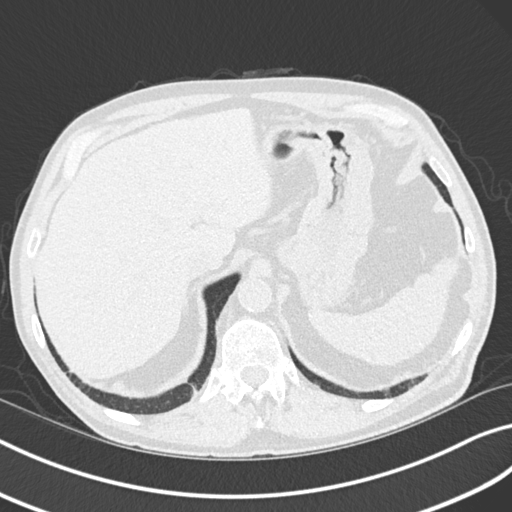
[im 58/191  lung]
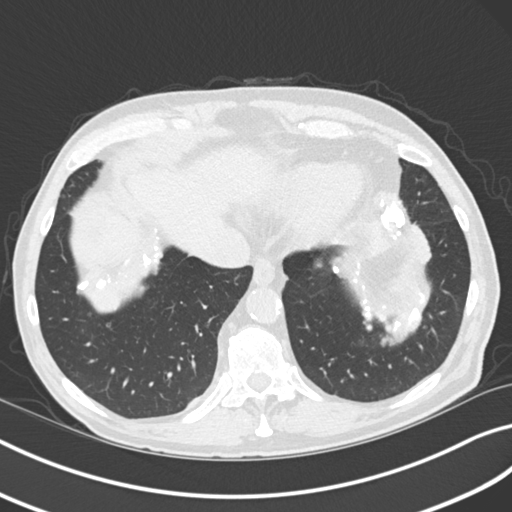
[im 75/191  mediastinal]
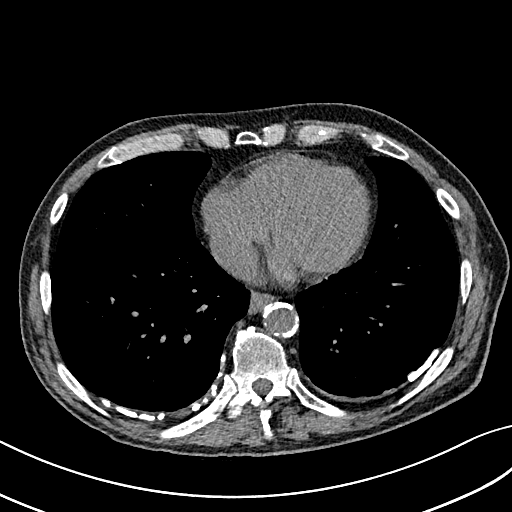
[im 75/191  lung]
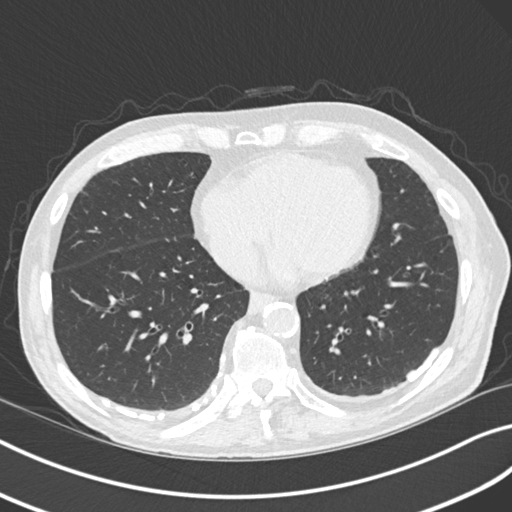
[im 91/191  lung]
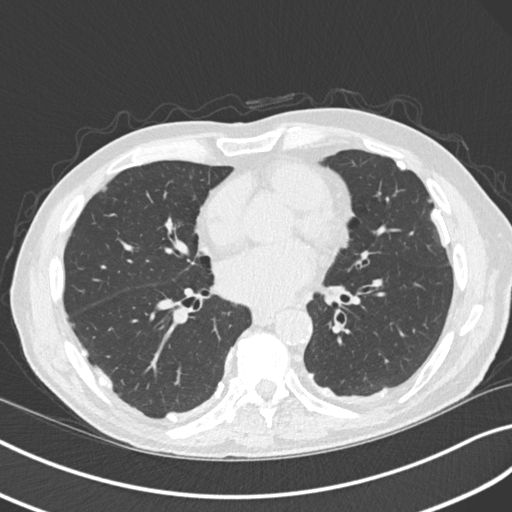
[im 100/191  lung]
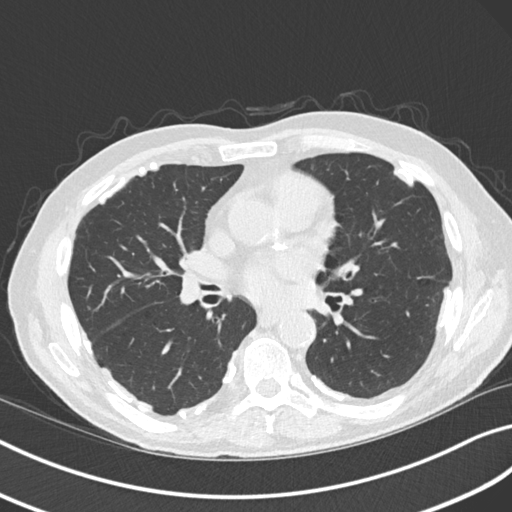
[im 116/191  lung]
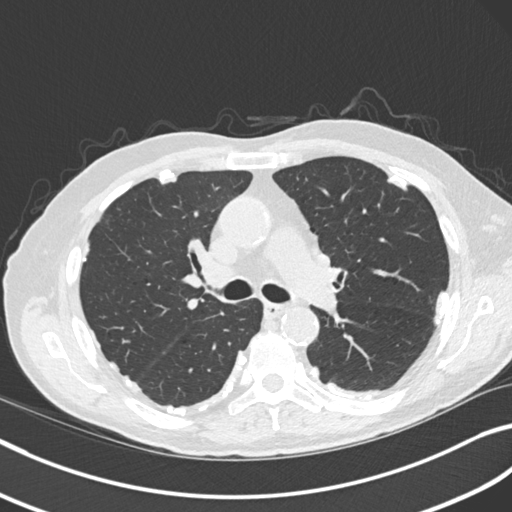
[im 133/191  mediastinal]
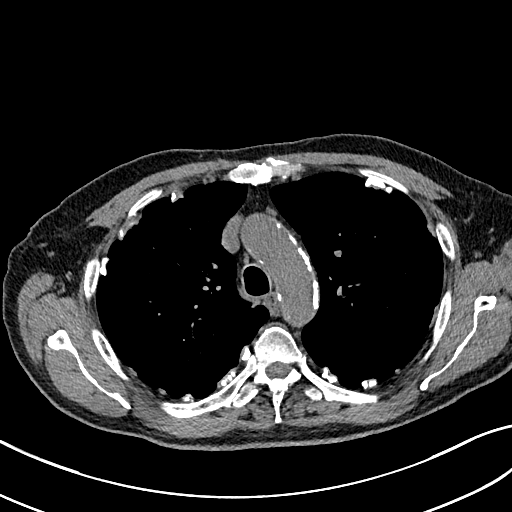
[im 133/191  lung]
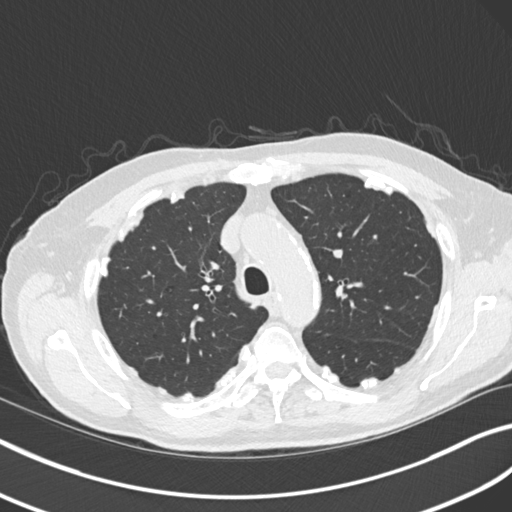
[im 149/191  lung]
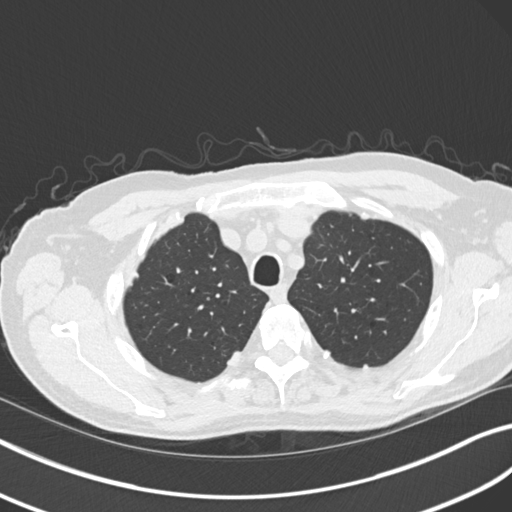
[im 166/191  lung]
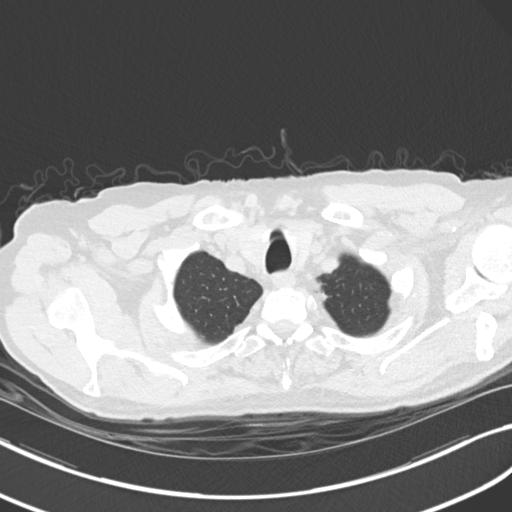
[im 182/191  lung]
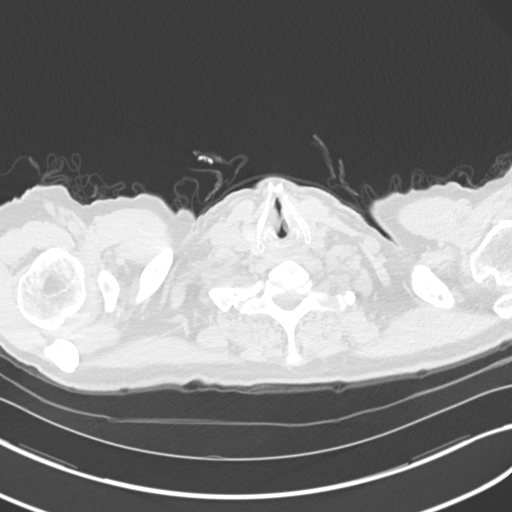

[Series 8: coronal · coronal · 0.74mm/px · 3 of 121 slices shown]
[im 25/121  lung]
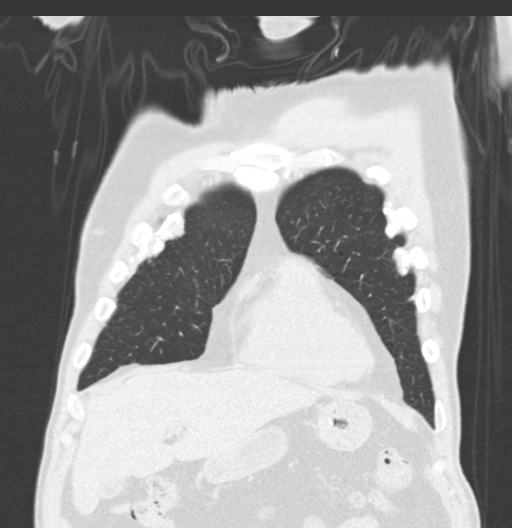
[im 49/121  lung]
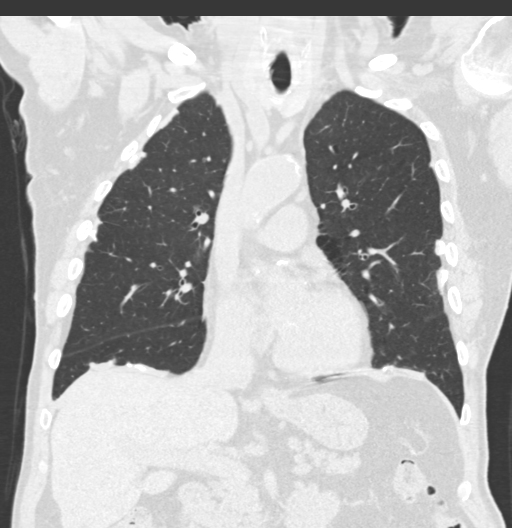
[im 73/121  lung]
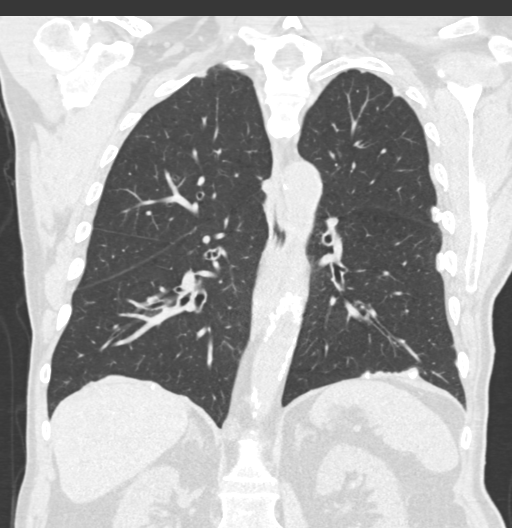

[15 of 36 positions shown; findings below may reference images not displayed]

FINDINGS: Cardiovascular: Normal heart size. No significant pericardial
fluid/thickening. Left main, left anterior descending and left
circumflex coronary atherosclerosis. Atherosclerotic nonaneurysmal
thoracic aorta. Normal caliber pulmonary arteries.

Mediastinum/Nodes: No discrete thyroid nodules. Unremarkable
esophagus. No pathologically enlarged axillary, mediastinal or gross
hilar lymph nodes, noting limited sensitivity for the detection of
hilar adenopathy on this noncontrast study. Coarse calcified
nonenlarged right paratracheal node compatible with prior
granulomatous disease.

Lungs/Pleura: No pneumothorax. Extensive calcified bilateral pleural
plaques without pleural effusions. No acute consolidative airspace
disease or lung masses. A few tiny scattered solid pulmonary
nodules, largest 4 mm in the posterior left upper lobe (series
3/image 80). Mild centrilobular emphysema with diffuse bronchial
wall thickening and scattered mucoid impaction predominantly in the
left lower lobe. No significant lobular air trapping on the
expiration sequence. No significant regions of subpleural
reticulation, ground-glass attenuation, parenchymal banding,
traction bronchiectasis, architectural distortion or frank
honeycombing.

Upper abdomen: Small coarsely calcified granuloma in the central
liver. Partially visualized simple appearing 1.6 cm liver cyst in
the far inferior right liver lobe. Subcentimeter hypodense anterior
left liver lobe lesion, too small to characterize. Punctate upper
left renal sinus calcifications, more likely vascular. Scattered
colonic diverticulosis.

Musculoskeletal: No aggressive appearing focal osseous lesions. Mild
thoracic spondylosis.
IMPRESSION: 1. Extensive bilateral calcified pleural plaques, compatible with
asbestos related pleural disease. No pleural effusions.
2. No evidence of interstitial lung disease.
3. Mild centrilobular emphysema with diffuse bronchial wall
thickening, suggesting COPD.
4. Scattered tiny solid pulmonary nodules, largest 4 mm. No
follow-up needed if patient is low-risk (and has no known or
suspected primary neoplasm). Non-contrast chest CT can be considered
in 12 months if patient is high-risk. This recommendation follows
the consensus statement: Guidelines for Management of Incidental
Pulmonary Nodules Detected on CT Images: From the [HOSPITAL]
5. Left main and two-vessel coronary atherosclerosis.

Aortic Atherosclerosis (REQPS-FE6.6) and Emphysema (REQPS-IET.1).

## 2019-08-29 DIAGNOSIS — C4441 Basal cell carcinoma of skin of scalp and neck: Secondary | ICD-10-CM | POA: Diagnosis not present

## 2019-08-29 DIAGNOSIS — D485 Neoplasm of uncertain behavior of skin: Secondary | ICD-10-CM | POA: Diagnosis not present

## 2019-08-29 DIAGNOSIS — L821 Other seborrheic keratosis: Secondary | ICD-10-CM | POA: Diagnosis not present

## 2019-09-13 DIAGNOSIS — H26492 Other secondary cataract, left eye: Secondary | ICD-10-CM | POA: Diagnosis not present

## 2019-09-13 DIAGNOSIS — H35341 Macular cyst, hole, or pseudohole, right eye: Secondary | ICD-10-CM | POA: Diagnosis not present

## 2019-09-13 DIAGNOSIS — H35372 Puckering of macula, left eye: Secondary | ICD-10-CM | POA: Diagnosis not present

## 2019-09-13 DIAGNOSIS — H524 Presbyopia: Secondary | ICD-10-CM | POA: Diagnosis not present

## 2019-09-20 DIAGNOSIS — H547 Unspecified visual loss: Secondary | ICD-10-CM | POA: Diagnosis not present

## 2019-09-20 DIAGNOSIS — J309 Allergic rhinitis, unspecified: Secondary | ICD-10-CM | POA: Diagnosis not present

## 2019-09-20 DIAGNOSIS — J449 Chronic obstructive pulmonary disease, unspecified: Secondary | ICD-10-CM | POA: Diagnosis not present

## 2019-09-20 DIAGNOSIS — Z720 Tobacco use: Secondary | ICD-10-CM | POA: Diagnosis not present

## 2019-09-20 DIAGNOSIS — I10 Essential (primary) hypertension: Secondary | ICD-10-CM | POA: Diagnosis not present

## 2019-09-20 DIAGNOSIS — Z008 Encounter for other general examination: Secondary | ICD-10-CM | POA: Diagnosis not present

## 2019-09-20 DIAGNOSIS — I739 Peripheral vascular disease, unspecified: Secondary | ICD-10-CM | POA: Diagnosis not present

## 2019-09-20 DIAGNOSIS — E785 Hyperlipidemia, unspecified: Secondary | ICD-10-CM | POA: Diagnosis not present

## 2019-09-20 DIAGNOSIS — Z7951 Long term (current) use of inhaled steroids: Secondary | ICD-10-CM | POA: Diagnosis not present

## 2019-09-20 DIAGNOSIS — M1990 Unspecified osteoarthritis, unspecified site: Secondary | ICD-10-CM | POA: Diagnosis not present

## 2019-09-20 DIAGNOSIS — K219 Gastro-esophageal reflux disease without esophagitis: Secondary | ICD-10-CM | POA: Diagnosis not present

## 2019-09-28 DIAGNOSIS — H35341 Macular cyst, hole, or pseudohole, right eye: Secondary | ICD-10-CM | POA: Diagnosis not present

## 2019-10-11 DIAGNOSIS — Z961 Presence of intraocular lens: Secondary | ICD-10-CM | POA: Diagnosis not present

## 2019-10-13 DIAGNOSIS — B029 Zoster without complications: Secondary | ICD-10-CM | POA: Diagnosis not present

## 2019-11-08 DIAGNOSIS — L03311 Cellulitis of abdominal wall: Secondary | ICD-10-CM | POA: Diagnosis not present

## 2020-03-14 DIAGNOSIS — J440 Chronic obstructive pulmonary disease with acute lower respiratory infection: Secondary | ICD-10-CM | POA: Diagnosis not present

## 2020-03-29 DIAGNOSIS — Z125 Encounter for screening for malignant neoplasm of prostate: Secondary | ICD-10-CM | POA: Diagnosis not present

## 2020-03-29 DIAGNOSIS — E785 Hyperlipidemia, unspecified: Secondary | ICD-10-CM | POA: Diagnosis not present

## 2020-03-29 DIAGNOSIS — I10 Essential (primary) hypertension: Secondary | ICD-10-CM | POA: Diagnosis not present

## 2020-03-29 DIAGNOSIS — J449 Chronic obstructive pulmonary disease, unspecified: Secondary | ICD-10-CM | POA: Diagnosis not present

## 2020-03-29 DIAGNOSIS — E1169 Type 2 diabetes mellitus with other specified complication: Secondary | ICD-10-CM | POA: Diagnosis not present

## 2020-03-29 DIAGNOSIS — I6990 Unspecified sequelae of unspecified cerebrovascular disease: Secondary | ICD-10-CM | POA: Diagnosis not present

## 2020-03-29 DIAGNOSIS — Z23 Encounter for immunization: Secondary | ICD-10-CM | POA: Diagnosis not present

## 2020-05-20 DIAGNOSIS — R21 Rash and other nonspecific skin eruption: Secondary | ICD-10-CM | POA: Diagnosis not present

## 2020-05-21 DIAGNOSIS — Z0000 Encounter for general adult medical examination without abnormal findings: Secondary | ICD-10-CM | POA: Diagnosis not present

## 2020-05-21 DIAGNOSIS — Z139 Encounter for screening, unspecified: Secondary | ICD-10-CM | POA: Diagnosis not present

## 2020-05-21 DIAGNOSIS — E785 Hyperlipidemia, unspecified: Secondary | ICD-10-CM | POA: Diagnosis not present

## 2020-05-21 DIAGNOSIS — Z9181 History of falling: Secondary | ICD-10-CM | POA: Diagnosis not present

## 2020-05-21 DIAGNOSIS — Z1331 Encounter for screening for depression: Secondary | ICD-10-CM | POA: Diagnosis not present

## 2020-09-26 DIAGNOSIS — E1169 Type 2 diabetes mellitus with other specified complication: Secondary | ICD-10-CM | POA: Diagnosis not present

## 2020-09-26 DIAGNOSIS — I6990 Unspecified sequelae of unspecified cerebrovascular disease: Secondary | ICD-10-CM | POA: Diagnosis not present

## 2020-09-26 DIAGNOSIS — Z87891 Personal history of nicotine dependence: Secondary | ICD-10-CM | POA: Diagnosis not present

## 2020-09-26 DIAGNOSIS — E785 Hyperlipidemia, unspecified: Secondary | ICD-10-CM | POA: Diagnosis not present

## 2020-09-26 DIAGNOSIS — I10 Essential (primary) hypertension: Secondary | ICD-10-CM | POA: Diagnosis not present

## 2020-09-26 DIAGNOSIS — Z6823 Body mass index (BMI) 23.0-23.9, adult: Secondary | ICD-10-CM | POA: Diagnosis not present

## 2020-09-26 DIAGNOSIS — J449 Chronic obstructive pulmonary disease, unspecified: Secondary | ICD-10-CM | POA: Diagnosis not present

## 2020-10-29 DIAGNOSIS — R001 Bradycardia, unspecified: Secondary | ICD-10-CM | POA: Diagnosis not present

## 2020-10-29 DIAGNOSIS — J441 Chronic obstructive pulmonary disease with (acute) exacerbation: Secondary | ICD-10-CM | POA: Diagnosis not present

## 2020-10-29 DIAGNOSIS — J61 Pneumoconiosis due to asbestos and other mineral fibers: Secondary | ICD-10-CM | POA: Diagnosis not present

## 2020-10-30 ENCOUNTER — Encounter: Payer: Self-pay | Admitting: *Deleted

## 2020-10-30 ENCOUNTER — Encounter: Payer: Self-pay | Admitting: Cardiology

## 2020-11-01 DIAGNOSIS — R0602 Shortness of breath: Secondary | ICD-10-CM | POA: Diagnosis not present

## 2020-11-01 DIAGNOSIS — J61 Pneumoconiosis due to asbestos and other mineral fibers: Secondary | ICD-10-CM | POA: Diagnosis not present

## 2020-11-05 ENCOUNTER — Other Ambulatory Visit: Payer: Self-pay

## 2020-11-05 ENCOUNTER — Ambulatory Visit (INDEPENDENT_AMBULATORY_CARE_PROVIDER_SITE_OTHER): Payer: Medicare HMO | Admitting: Cardiology

## 2020-11-05 ENCOUNTER — Inpatient Hospital Stay (HOSPITAL_COMMUNITY)
Admission: EM | Admit: 2020-11-05 | Discharge: 2020-11-07 | DRG: 244 | Disposition: A | Discharge status: Home / Self Care | Payer: Medicare HMO | Attending: Cardiology | Admitting: Cardiology

## 2020-11-05 ENCOUNTER — Emergency Department (HOSPITAL_COMMUNITY): Payer: Medicare HMO

## 2020-11-05 ENCOUNTER — Inpatient Hospital Stay (HOSPITAL_COMMUNITY): Payer: Medicare HMO

## 2020-11-05 ENCOUNTER — Encounter (HOSPITAL_COMMUNITY): Payer: Self-pay | Admitting: Internal Medicine

## 2020-11-05 VITALS — BP 160/56 | HR 42 | Ht 71.0 in | Wt 163.0 lb

## 2020-11-05 DIAGNOSIS — Z885 Allergy status to narcotic agent status: Secondary | ICD-10-CM

## 2020-11-05 DIAGNOSIS — I6521 Occlusion and stenosis of right carotid artery: Secondary | ICD-10-CM

## 2020-11-05 DIAGNOSIS — I499 Cardiac arrhythmia, unspecified: Secondary | ICD-10-CM | POA: Diagnosis not present

## 2020-11-05 DIAGNOSIS — E1169 Type 2 diabetes mellitus with other specified complication: Secondary | ICD-10-CM | POA: Diagnosis not present

## 2020-11-05 DIAGNOSIS — R69 Illness, unspecified: Secondary | ICD-10-CM | POA: Diagnosis not present

## 2020-11-05 DIAGNOSIS — I459 Conduction disorder, unspecified: Secondary | ICD-10-CM | POA: Diagnosis not present

## 2020-11-05 DIAGNOSIS — I10 Essential (primary) hypertension: Secondary | ICD-10-CM | POA: Diagnosis not present

## 2020-11-05 DIAGNOSIS — I517 Cardiomegaly: Secondary | ICD-10-CM | POA: Diagnosis not present

## 2020-11-05 DIAGNOSIS — Z79899 Other long term (current) drug therapy: Secondary | ICD-10-CM

## 2020-11-05 DIAGNOSIS — Z7951 Long term (current) use of inhaled steroids: Secondary | ICD-10-CM

## 2020-11-05 DIAGNOSIS — I4510 Unspecified right bundle-branch block: Secondary | ICD-10-CM

## 2020-11-05 DIAGNOSIS — Z7982 Long term (current) use of aspirin: Secondary | ICD-10-CM | POA: Diagnosis not present

## 2020-11-05 DIAGNOSIS — Z95818 Presence of other cardiac implants and grafts: Secondary | ICD-10-CM

## 2020-11-05 DIAGNOSIS — I441 Atrioventricular block, second degree: Secondary | ICD-10-CM | POA: Diagnosis not present

## 2020-11-05 DIAGNOSIS — J449 Chronic obstructive pulmonary disease, unspecified: Secondary | ICD-10-CM

## 2020-11-05 DIAGNOSIS — R001 Bradycardia, unspecified: Secondary | ICD-10-CM | POA: Diagnosis not present

## 2020-11-05 DIAGNOSIS — I442 Atrioventricular block, complete: Secondary | ICD-10-CM

## 2020-11-05 DIAGNOSIS — R0602 Shortness of breath: Secondary | ICD-10-CM | POA: Diagnosis not present

## 2020-11-05 DIAGNOSIS — Z8673 Personal history of transient ischemic attack (TIA), and cerebral infarction without residual deficits: Secondary | ICD-10-CM

## 2020-11-05 DIAGNOSIS — E785 Hyperlipidemia, unspecified: Secondary | ICD-10-CM | POA: Diagnosis not present

## 2020-11-05 DIAGNOSIS — Z8249 Family history of ischemic heart disease and other diseases of the circulatory system: Secondary | ICD-10-CM | POA: Diagnosis not present

## 2020-11-05 DIAGNOSIS — R0609 Other forms of dyspnea: Secondary | ICD-10-CM

## 2020-11-05 DIAGNOSIS — R0600 Dyspnea, unspecified: Secondary | ICD-10-CM

## 2020-11-05 DIAGNOSIS — Z743 Need for continuous supervision: Secondary | ICD-10-CM | POA: Diagnosis not present

## 2020-11-05 DIAGNOSIS — Z823 Family history of stroke: Secondary | ICD-10-CM | POA: Diagnosis not present

## 2020-11-05 DIAGNOSIS — F17210 Nicotine dependence, cigarettes, uncomplicated: Secondary | ICD-10-CM | POA: Diagnosis present

## 2020-11-05 DIAGNOSIS — E782 Mixed hyperlipidemia: Secondary | ICD-10-CM

## 2020-11-05 DIAGNOSIS — F17200 Nicotine dependence, unspecified, uncomplicated: Secondary | ICD-10-CM

## 2020-11-05 DIAGNOSIS — Z20822 Contact with and (suspected) exposure to covid-19: Secondary | ICD-10-CM | POA: Diagnosis present

## 2020-11-05 LAB — COMPREHENSIVE METABOLIC PANEL
ALT: 34 U/L (ref 0–44)
AST: 19 U/L (ref 15–41)
Albumin: 3.1 g/dL — ABNORMAL LOW (ref 3.5–5.0)
Alkaline Phosphatase: 53 U/L (ref 38–126)
Anion gap: 8 (ref 5–15)
BUN: 31 mg/dL — ABNORMAL HIGH (ref 8–23)
CO2: 26 mmol/L (ref 22–32)
Calcium: 9.7 mg/dL (ref 8.9–10.3)
Chloride: 102 mmol/L (ref 98–111)
Creatinine, Ser: 1.15 mg/dL (ref 0.61–1.24)
GFR, Estimated: >60 mL/min (ref >60)
Glucose, Bld: 124 mg/dL — ABNORMAL HIGH (ref 70–99)
Potassium: 4.8 mmol/L (ref 3.5–5.1)
Sodium: 136 mmol/L (ref 135–145)
Total Bilirubin: 0.2 mg/dL — ABNORMAL LOW (ref 0.3–1.2)
Total Protein: 6 g/dL — ABNORMAL LOW (ref 6.5–8.1)

## 2020-11-05 LAB — MAGNESIUM: Magnesium: 1.9 mg/dL (ref 1.7–2.4)

## 2020-11-05 LAB — RESP PANEL BY RT-PCR (FLU A&B, COVID) ARPGX2
Influenza A by PCR: NEGATIVE
Influenza B by PCR: NEGATIVE
SARS Coronavirus 2 by RT PCR: NEGATIVE

## 2020-11-05 LAB — CBC WITH DIFFERENTIAL/PLATELET
Abs Immature Granulocytes: 0.58 10*3/uL — ABNORMAL HIGH (ref 0.00–0.07)
Basophils Absolute: 0 10*3/uL (ref 0.0–0.1)
Basophils Relative: 0 %
Eosinophils Absolute: 0.1 10*3/uL (ref 0.0–0.5)
Eosinophils Relative: 1 %
HCT: 38.9 % — ABNORMAL LOW (ref 39.0–52.0)
Hemoglobin: 12.7 g/dL — ABNORMAL LOW (ref 13.0–17.0)
Immature Granulocytes: 3 %
Lymphocytes Relative: 19 %
Lymphs Abs: 3.3 10*3/uL (ref 0.7–4.0)
MCH: 28.3 pg (ref 26.0–34.0)
MCHC: 32.6 g/dL (ref 30.0–36.0)
MCV: 86.6 fL (ref 80.0–100.0)
Monocytes Absolute: 1.2 10*3/uL — ABNORMAL HIGH (ref 0.1–1.0)
Monocytes Relative: 7 %
Neutro Abs: 12.5 10*3/uL — ABNORMAL HIGH (ref 1.7–7.7)
Neutrophils Relative %: 70 %
Platelets: 243 10*3/uL (ref 150–400)
RBC: 4.49 MIL/uL (ref 4.22–5.81)
RDW: 14.8 % (ref 11.5–15.5)
WBC: 17.7 10*3/uL — ABNORMAL HIGH (ref 4.0–10.5)
nRBC: 0 % (ref 0.0–0.2)

## 2020-11-05 LAB — ECHOCARDIOGRAM COMPLETE
Area-P 1/2: 2.02 cm2
Height: 71 in
S' Lateral: 2.4 cm
Weight: 2608 oz

## 2020-11-05 LAB — SURGICAL PCR SCREEN
MRSA, PCR: NEGATIVE
Staphylococcus aureus: NEGATIVE

## 2020-11-05 LAB — GLUCOSE, CAPILLARY: Glucose-Capillary: 135 mg/dL — ABNORMAL HIGH (ref 70–99)

## 2020-11-05 MED ORDER — FLUTICASONE-UMECLIDIN-VILANT 100-62.5-25 MCG/INH IN AEPB: 1.0000 | INHALATION_SPRAY | Freq: Every day | RESPIRATORY_TRACT | Status: DC

## 2020-11-05 MED ORDER — SODIUM CHLORIDE 0.9 % IV SOLN
INTRAVENOUS | Status: DC
Start: 2020-11-05 — End: 2020-11-06

## 2020-11-05 MED ORDER — CHLORHEXIDINE GLUCONATE 4 % EX LIQD
60.0000 mL | Freq: Once | CUTANEOUS | Status: AC
  Administered 2020-11-06: 4 via TOPICAL
  Filled 2020-11-05: qty 60

## 2020-11-05 MED ORDER — FLUTICASONE FUROATE-VILANTEROL 100-25 MCG/INH IN AEPB
1.0000 | INHALATION_SPRAY | Freq: Every day | RESPIRATORY_TRACT | Status: DC
  Administered 2020-11-06 – 2020-11-07 (×2): 1 via RESPIRATORY_TRACT
  Filled 2020-11-05: qty 28

## 2020-11-05 MED ORDER — UMECLIDINIUM BROMIDE 62.5 MCG/INH IN AEPB
1.0000 | INHALATION_SPRAY | Freq: Every day | RESPIRATORY_TRACT | Status: DC
  Administered 2020-11-06 – 2020-11-07 (×2): 1 via RESPIRATORY_TRACT
  Filled 2020-11-05: qty 7

## 2020-11-05 MED ORDER — CHLORHEXIDINE GLUCONATE 4 % EX LIQD
60.0000 mL | Freq: Once | CUTANEOUS | Status: AC
  Administered 2020-11-05: 4 via TOPICAL
  Filled 2020-11-05: qty 60

## 2020-11-05 MED ORDER — CEFAZOLIN SODIUM-DEXTROSE 2-4 GM/100ML-% IV SOLN
2.0000 g | INTRAVENOUS | Status: AC
  Administered 2020-11-06: 2 g via INTRAVENOUS

## 2020-11-05 MED ORDER — ATORVASTATIN CALCIUM 80 MG PO TABS
80.0000 mg | ORAL_TABLET | Freq: Every day | ORAL | Status: DC
  Administered 2020-11-05: 80 mg via ORAL
  Filled 2020-11-05: qty 1

## 2020-11-05 MED ORDER — SODIUM CHLORIDE 0.9 % IV SOLN: INTRAVENOUS | Status: DC

## 2020-11-05 MED ORDER — SODIUM CHLORIDE 0.9 % IV SOLN
80.0000 mg | INTRAVENOUS | Status: AC
  Administered 2020-11-06: 80 mg

## 2020-11-05 MED ORDER — LORATADINE 10 MG PO TABS
10.0000 mg | ORAL_TABLET | Freq: Every day | ORAL | Status: DC
  Administered 2020-11-06 – 2020-11-07 (×2): 10 mg via ORAL
  Filled 2020-11-05 (×2): qty 1

## 2020-11-05 MED ORDER — ALBUTEROL SULFATE HFA 108 (90 BASE) MCG/ACT IN AERS
2.0000 | INHALATION_SPRAY | Freq: Four times a day (QID) | RESPIRATORY_TRACT | Status: DC | PRN
  Filled 2020-11-05: qty 6.7

## 2020-11-05 NOTE — H&P (Addendum)
ELECTROPHYSIOLOGY H&P NOTE    Patient ID: Covey Baller MRN: [id number], DOB/AGE: 1941/01/25 80 y.o.  Admit date: 11/05/2020 Date of Consult: 11/05/2020  Primary Physician: Nicoletta Dress, MD Primary Cardiologist: Dr. Agustin Cree Electrophysiologist: New  Reason for admission: Advanced AV block  Patient Profile: John Wiggins is a 80 y.o. male with a history of COPD, carotid artery disease, and tobacco abuse who is being seen today for the evaluation of symptomatic bradycardia at the request of Dr. Agustin Cree.  HPI:  John Wiggins is a 80 y.o. male with medical history as above.   Pt has had a 1 week history of fatigue and SOB with exertion. Was seen by PCP who noted bradycardia and referred urgently to cards. He was seen by Dr. Agustin Cree where EKG showed 2:1 AV block with rates in upper 30-40s. Pt was transferred to [facility] via EMS  Pt is currently awake and alert. Labs pending. He denies any syncope. He has mild lightheadedness with standing, and has noticed mild SOB with any exertion over the past week. He was smoking 1 ppd up to 3 days ago. He denies exertional chest pain, palpitations, PND, nausea, vomiting, or edema.   Echo 02/2016 showed LVEF 60-65% Carotids 11/29/2018 Bilateral 1-39% stenosis  Past Medical History:  Diagnosis Date  . Allergy   . Asbestos-induced pleural plaque 09/14/2017  . Asthma   . Carotid artery disease ([place])    a. Carotid US 8/17: RICA 50-69% // b. Neck CTA 8/17: pRICA 35%  . COPD with chronic bronchitis and emphysema ([place]) 09/14/2017  . History of TIA (transient ischemic attack)    02/2016; L arm weakness  . Hyperlipidemia, unspecified 03/18/2016  . Hypertension   . Laceration of blood vessel of right ring finger 10/01/2016  . 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 RAD (reactive airway disease)   . Snoring 03/18/2016  . Tobacco dependence 03/18/2016  . Type 2 diabetes mellitus with hyperlipidemia  [facility])      Surgical History:  Past Surgical History:  Procedure Laterality Date  . CATARACT EXTRACTION Bilateral   . SKIN GRAFT Right    Right 4th finger  . TONSILLECTOMY       (Not in a hospital admission)   Inpatient Medications:   Allergies:  Allergies  Allergen Reactions  . Hydrocodone-Acetaminophen Rash    Social History   Socioeconomic History  . Marital status: Married    Spouse name: Not on file  . Number of children: Not on file  . Years of education: Not on file  . Highest education level: Not on file  Occupational History  . Not on file  Tobacco Use  . Smoking status: Current Every Day Smoker    Packs/day: 0.50    Years: 60.00    Pack years: 30.00    Types: Cigarettes    Last attempt to quit: 02/28/2016    Years since quitting: 4.6  . Smokeless tobacco: Never Used  . Tobacco comment: 2 or 3 cigs a day, on chantix  Vaping Use  . Vaping Use: Never used  Substance and Sexual Activity  . Alcohol use: No    Comment: social  . Drug use: No  . Sexual activity: Not on file  Other Topics Concern  . Not on file  Social History Narrative   Retired   Worked maintenance for [employer]   Prior worked with [employer]   Wife - Inez Catalina (married in  2014)   2 sons   2 grandsons   Lives in [place], [place]   Social Determinants of Health   Financial Resource Strain: Not on file  Food Insecurity: Not on file  Transportation Needs: Not on file  Physical Activity: Not on file  Stress: Not on file  Social Connections: Not on file  Intimate Partner Violence: Not on file     Family History  Problem Relation Age of Onset  . Hypertension Mother   . Stroke Mother   . Heart disease Father        CAD; s/p CABG  . Skin cancer Father   . Atrial fibrillation Brother   . Lung cancer Brother   . Bladder Cancer Brother      Review of Systems: All other systems reviewed and are otherwise negative except as noted above.  Physical Exam: Vitals:    11/05/20 1501  BP: (!) 217/54  Pulse: (!) 36  Resp: 14  Temp: 98.4 F (36.9 C)  TempSrc: Oral  SpO2: 99%  Weight: 73.9 kg  Height: 5\' 11"  (1.803 m)    GEN- The patient is well appearing, alert and oriented x 3 today.   HEENT: normocephalic, atraumatic; sclera clear, conjunctiva pink; hearing intact; oropharynx clear; neck supple Lungs- Clear to ausculation bilaterally, normal work of breathing.  No wheezes, rales, rhonchi Heart- Regular rate and rhythm, no murmurs, rubs or gallops GI- soft, non-tender, non-distended, bowel sounds present Extremities- no clubbing, cyanosis, or edema; DP/PT/radial pulses 2+ bilaterally MS- no significant deformity or atrophy Skin- warm and dry, no rash or lesion Psych- euthymic mood, full affect Neuro- strength and sensation are intact  Labs:   Lab Results  Component Value Date   WBC 13.3 (H) 08/05/2017   HGB 14.5 08/05/2017   HCT 43.9 08/05/2017   MCV 84.8 08/05/2017   PLT 214.0 08/05/2017   No results for input(s): NA, K, CL, CO2, BUN, CREATININE, CALCIUM, PROT, BILITOT, ALKPHOS, ALT, AST, GLUCOSE in the last 168 hours.  Invalid input(s): LABALBU    Radiology/Studies: No results found.  EKG: on arrival to [facility] shows 2:1 AV block with PVCs at 38 bpm (personally reviewed)  TELEMETRY: Advanced AV block including intermittent complete heart block and 2:1 AV block, HRs 30-40s.  (personally reviewed)  Assessment/Plan: 1.  Symptomatic bradycardia due to advanced AV block with at least 2:1 block, Mobitz II. ? Intermittent CHB Update Echo Labs pending COVID test pending No reversible causes identified thus far. If EF low, with smoking history will likely need ischemic evaluation.  Explained risks, benefits, and alternatives to PPM implantation, including but not limited to bleeding, infection, pneumothorax, pericardial effusion, lead dislodgement, heart attack, stroke, or death.  Pt verbalized understanding and agrees to proceed.  2.  HTN Exacerbated in setting of AV block.  Avoid AV nodal agents.   3. Tobacco Abuse Smoking 1 ppd up to this weekend. Encourage cessation.   Pt did have a small lunch of crackers and peanut butter. He is currently hemodynamically stable and requires testing as above. Will plan for tentative PPM placement tomorrow with Dr. Curt Bears. Will have NPO after midnight and place device orders.  For questions or updates, please contact [place] Please consult [email or web address] for contact info under Cardiology/STEMI.  Signed, Shirley Friar, PA-C  11/05/2020 3:30 PM   I have seen, examined the patient, and reviewed the above assessment and plan.  Changes to above are made where necessary.  On exam, bradycardic rhythm The patient has symptomatic bradycardia.  I would therefore recommend pacemaker implantation.  Risks, benefits, alternatives to pacemaker implantation were discussed in detail with the patient today. The patient understands that the risks include but are not limited to bleeding, infection, pneumothorax, perforation, tamponade, vascular damage, renal failure, MI, stroke, death,  and lead dislodgement and wishes to proceed. We will therefore schedule the procedure at the next available time with Dr Curt Bears.  Co Sign: Thompson Grayer, MD

## 2020-11-05 NOTE — Plan of Care (Signed)

## 2020-11-05 NOTE — Progress Notes (Signed)
  Echocardiogram 2D Echocardiogram has been performed.  Merrie Roof F 11/05/2020, 5:40 PM

## 2020-11-05 NOTE — Progress Notes (Signed)
Cardiology Consultation:    Date:  11/05/2020   ID:  John Wiggins, DOB 1940/11/20, MRN [id number]  PCP:  John Dress, MD  Cardiologist:  Jenne Campus, MD   Referring MD: John Dress, MD   Chief Complaint  Patient presents with  . Low Heart Rate   . Shortness of Breath    History of Present Illness:    John Wiggins is a 80 y.o. male who is being seen today for the evaluation of bradycardia at the request of John Dress, MD.  With past medical history significant for COPD, chronic smoking until last Monday when he stopped, also asbestosis, he was referred to [place] because of bradycardia.  He went to his primary care physician who noticed to be bradycardic EKG today showed second-degree most likely type II AV block with 2-1 conduction and ventricular rate of 39-41.  He does not have episode of syncope but he does have some dizziness when he is getting up and he also noticed worsening of shortness of breath within the last few weeks.  It is difficult to determine exactly for how long he is being in second-degree AV block however shortness of breath is a chronic problem that became worse lately.  Denies have any chest pain tightness squeezing pressure burning chest there is no swelling of lower extremities.  Past Medical History:  Diagnosis Date  . Allergy   . Asbestos-induced pleural plaque 09/14/2017  . Asthma   . Carotid artery disease ([place])    a. Carotid US 8/17: RICA 50-69% // b. Neck CTA 8/17: pRICA 35%  . COPD with chronic bronchitis and emphysema ([place]) 09/14/2017  . History of TIA (transient ischemic attack)    02/2016; L arm weakness  . Hyperlipidemia, unspecified 03/18/2016  . Hypertension   . Laceration of blood vessel of right ring finger 10/01/2016  . 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 RAD (reactive airway disease)   . Snoring 03/18/2016  . Tobacco dependence 03/18/2016  . Type 2 diabetes mellitus  with hyperlipidemia [facility])     Past Surgical History:  Procedure Laterality Date  . CATARACT EXTRACTION Bilateral   . SKIN GRAFT Right    Right 4th finger  . TONSILLECTOMY      Current Medications: Current Meds  Medication Sig  . aspirin EC 81 MG tablet Take 81 mg by mouth daily.  Marland Kitchen atorvastatin (LIPITOR) 80 MG tablet Take 80 mg by mouth daily.   . cetirizine (ZYRTEC) 10 MG tablet Take 10 mg by mouth daily.  . Fluticasone-Umeclidin-Vilant (TRELEGY ELLIPTA) 100-62.5-25 MCG/INH AEPB Inhale 1 puff into the lungs daily.  Marland Kitchen PROAIR HFA 108 (90 Base) MCG/ACT inhaler Inhale 2 puffs into the lungs 4 (four) times daily as needed for wheezing or shortness of breath.  . valsartan (DIOVAN) 80 MG tablet Take 80 mg by mouth daily.     Allergies:   Hydrocodone-acetaminophen   Social History   Socioeconomic History  . Marital status: Married    Spouse name: Not on file  . Number of children: Not on file  . Years of education: Not on file  . Highest education level: Not on file  Occupational History  . Not on file  Tobacco Use  . Smoking status: Current Every Day Smoker    Packs/day: 0.50    Years: 60.00    Pack years: 30.00    Types: Cigarettes    Last attempt  to quit: 02/28/2016    Years since quitting: 4.6  . Smokeless tobacco: Never Used  . Tobacco comment: 2 or 3 cigs a day, on chantix  Vaping Use  . Vaping Use: Never used  Substance and Sexual Activity  . Alcohol use: No    Comment: social  . Drug use: No  . Sexual activity: Not on file  Other Topics Concern  . Not on file  Social History Narrative   Retired   Worked maintenance for [employer]   Prior worked with [employer]   Wife - John Wiggins (married in 2014)   2 sons   2 grandsons   Lives in [place], [place]   Social Determinants of Health   Financial Resource Strain: Not on file  Food Insecurity: Not on file  Transportation Needs: Not on file  Physical Activity: Not on file  Stress: Not on file  Social  Connections: Not on file     Family History: The patient's family history includes Atrial fibrillation in his brother; Bladder Cancer in his brother; Heart disease in his father; Hypertension in his mother; Lung cancer in his brother; Skin cancer in his father; Stroke in his mother. ROS:   Please see the history of present illness.    All 14 point review of systems negative except as described per history of present illness.  EKGs/Labs/Other Studies Reviewed:    The following studies were reviewed today:   EKG:  EKG is  ordered today.  The ekg ordered today demonstrates sinus rhythm with second-degree AV block with 2-1 AV conduction, QRS complex morphology is right bundle branch and left anterior hemiblock.  Recent Labs: No results found for requested labs within last 8760 hours.  Recent Lipid Panel No results found for: CHOL, TRIG, HDL, CHOLHDL, VLDL, LDLCALC, LDLDIRECT  Physical Exam:    VS:  BP (!) 160/56 (BP Location: Left Arm, Patient Position: Sitting)   Pulse (!) 42   Ht 5\' 11"  (1.803 m)   Wt 163 lb (73.9 kg)   SpO2 92%   BMI 22.73 kg/m     Wt Readings from Last 3 Encounters:  11/05/20 163 lb (73.9 kg)  10/29/20 163 lb (73.9 kg)  09/14/17 169 lb 6.4 oz (76.8 kg)     GEN:  Well nourished, well developed in no acute distress HEENT: Normal NECK: No JVD; No carotid bruits LYMPHATICS: No lymphadenopathy CARDIAC: RRR, bradycardic, holosystolic murmur grade 1/6 best heard left border of the sternum, no rubs, no gallops RESPIRATORY:  Clear to auscultation without rales, wheezing or rhonchi  ABDOMEN: Soft, non-tender, non-distended MUSCULOSKELETAL:  No edema; No deformity  SKIN: Warm and dry NEUROLOGIC:  Alert and oriented x 3 PSYCHIATRIC:  Normal affect   ASSESSMENT:    1. Stenosis of right carotid artery   2. Second degree AV block   3. Right bundle branch block   4. Mixed hyperlipidemia   5. Tobacco dependence   6. COPD with chronic bronchitis and emphysema  ([place])   7. Dyspnea on exertion    PLAN:    In order of problems listed above:  1. Bradycardia with second-degree AV block with some dizziness.  With reaching criteria for permanent pacemaker implantation.  I suspect AV block is infrahisian take into the consideration right bundle branch block left anterior hemiblock, I did talk to my colleagues from [facility] patient will be transferred over there and he will have permanent pacemaker implanted.  I did explain procedure to him including all risk benefits  as well as alternatives.  No AV blocking agents. 2. Mixed dyslipidemia, he is on Lipitor which I will continue him, I did review his K PN which show me his LDL of 56 HDL 36 which is excellent. 3. Tobacco abuse, likely he stopped last Monday.  This is because of progressive shortness of breath.  I encouraged him to stay away from it and congratulated. 4. Peripheral vascular disease in form of carotic arterial stenosis apparently in 2017 he did have a CVA after that carotic ultrasounds being done in around the hospital apparently showed significant blockages after that test was repeated at [facility] and showed no significant stenosis since that time I do not see any follow-up.  We will make arrangements for following up on that issues. 5. He also deserves to have an echocardiogram make sure his left ventricle ejection fraction is preserved that will help [place] to make a decision what Device he will required. 6. Essential hypertension, we will continue monitoring and treat accordingly. 7. Type 2 diabetes I did review his K PN which show me his hemoglobin A1c of 6.4 from September 26, 2020.  This is without any medications   Case discussed with Chalmers Cater PA from EP team at [facility].  Medication Adjustments/Labs and Tests Ordered: Current medicines are reviewed at length with the patient today.  Concerns regarding medicines are outlined above.  No orders of the defined types were placed in this  encounter.  No orders of the defined types were placed in this encounter.   Signed, Park Liter, MD, FACC. 11/05/2020 1:59 PM    [place]

## 2020-11-05 NOTE — Patient Instructions (Signed)
Medication Instructions:  Your physician recommends that you continue on your current medications as directed. Please refer to the Current Medication list given to you today.  *If you need a refill on your cardiac medications before your next appointment, please call your pharmacy*   Lab Work: None If you have labs (blood work) drawn today and your tests are completely normal, you will receive your results only by: Marland Kitchen MyChart Message (if you have MyChart) OR . A paper copy in the mail If you have any lab test that is abnormal or we need to change your treatment, we will call you to review the results.   Testing/Procedures: None   Follow-Up: At [facility], you and your health needs are our priority.  As part of our continuing mission to provide you with exceptional heart care, we have created designated Provider Care Teams.  These Care Teams include your primary Cardiologist (physician) and Advanced Practice Providers (APPs -  Physician Assistants and Nurse Practitioners) who all work together to provide you with the care you need, when you need it.  We recommend signing up for the patient portal called "MyChart".  Sign up information is provided on this After Visit Summary.  MyChart is used to connect with patients for Virtual Visits (Telemedicine).  Patients are able to view lab/test results, encounter notes, upcoming appointments, etc.  Non-urgent messages can be sent to your provider as well.   To learn more about what you can do with MyChart, go to [email or web address].    Your next appointment:     Go to [facility] Emergency Department. Please follow up with [place] afterward.

## 2020-11-05 NOTE — ED Triage Notes (Signed)
Pt arrived via [facility] with c/c of low heart rate. Per EMS pt is from [facility] where they saw his HR was low. EKG completed and noted to have 2nd degree type 2. Pt has had some dizziness and shob previously but none present today.   192/98, 36HR, 98%RA,

## 2020-11-05 NOTE — ED Provider Notes (Signed)
[facility] EMERGENCY DEPARTMENT Provider Note   CSN: [id number] Arrival date & time: 11/05/20  1456     History Chief Complaint  Patient presents with  . Bradycardia    Pt arrived via [facility] with c/c of low heart rate. Per EMS pt is from [place] [facility] where they saw his HR was low. EKG completed and noted to have 2nd degree type 2. Pt has had some dizziness and shob previously but none present today.   192/98, 36HR, 98%RA,         John Wiggins is a 80 y.o. male.  Patient here from cardiology clinic with high degree heart block.  Has been having some dizziness.  Was seen at primary care doctor's office and found to have low heart rate.  Saw cardiology today and they noticed high degree heart block.  Plan is for admission and pacemaker.  He is hemodynamically stable at this time.Asymptomatic.  The history is provided by the patient.  Dizziness Quality:  Lightheadedness Severity:  Mild Onset quality:  Gradual Timing:  Intermittent Progression:  Waxing and waning Chronicity:  New Relieved by:  Nothing Worsened by:  Nothing Associated symptoms: no chest pain, no palpitations, no shortness of breath and no vomiting   Risk factors: no multiple medications        Past Medical History:  Diagnosis Date  . Allergy   . Asbestos-induced pleural plaque 09/14/2017  . Asthma   . Carotid artery disease ([place])    a. Carotid US 8/17: RICA 50-69% // b. Neck CTA 8/17: pRICA 35%  . COPD with chronic bronchitis and emphysema ([place]) 09/14/2017  . History of TIA (transient ischemic attack)    02/2016; L arm weakness  . Hyperlipidemia, unspecified 03/18/2016  . Hypertension   . Laceration of blood vessel of right ring finger 10/01/2016  . 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 RAD (reactive airway disease)   . Snoring 03/18/2016  . Tobacco dependence 03/18/2016  . Type 2 diabetes mellitus with hyperlipidemia [facility])      Patient Active Problem List   Diagnosis Date Noted  . Second degree AV block 11/05/2020  . Right bundle branch block 11/05/2020  . Dyspnea on exertion 11/05/2020  . COPD with chronic bronchitis and emphysema ([place]) 09/14/2017  . Asbestos-induced pleural plaque 09/14/2017  . Laceration of blood vessel of right ring finger 10/01/2016  . Hyperlipidemia, unspecified 03/18/2016  . Snoring 03/18/2016  . Tobacco dependence 03/18/2016  . Carotid artery disease [facility])     Past Surgical History:  Procedure Laterality Date  . CATARACT EXTRACTION Bilateral   . SKIN GRAFT Right    Right 4th finger  . TONSILLECTOMY         Family History  Problem Relation Age of Onset  . Hypertension Mother   . Stroke Mother   . Heart disease Father        CAD; s/p CABG  . Skin cancer Father   . Atrial fibrillation Brother   . Lung cancer Brother   . Bladder Cancer Brother     Social History   Tobacco Use  . Smoking status: Current Every Day Smoker    Packs/day: 0.50    Years: 60.00    Pack years: 30.00    Types: Cigarettes    Last attempt to quit: 02/28/2016    Years since quitting: 4.6  . Smokeless tobacco: Never Used  . Tobacco comment:  2 or 3 cigs a day, on chantix  Vaping Use  . Vaping Use: Never used  Substance Use Topics  . Alcohol use: No    Comment: social  . Drug use: No    Home Medications Prior to Admission medications   Medication Sig Start Date End Date Taking? Authorizing Provider  aspirin EC 81 MG tablet Take 81 mg by mouth daily.    [provider]  atorvastatin (LIPITOR) 80 MG tablet Take 80 mg by mouth daily.  02/15/16   [provider]  cetirizine (ZYRTEC) 10 MG tablet Take 10 mg by mouth daily.    [provider]  Fluticasone-Umeclidin-Vilant (TRELEGY ELLIPTA) 100-62.5-25 MCG/INH AEPB Inhale 1 puff into the lungs daily.    [provider]  PROAIR HFA 108 [phone] Base) MCG/ACT inhaler Inhale 2 puffs into the lungs 4 (four) times  daily as needed for wheezing or shortness of breath. 02/19/16   [provider]  valsartan (DIOVAN) 80 MG tablet Take 80 mg by mouth daily. 06/21/20   [provider]    Allergies    Hydrocodone-acetaminophen  Review of Systems   Review of Systems  Constitutional: Negative for chills and fever.  HENT: Negative for ear pain and sore throat.   Eyes: Negative for pain and visual disturbance.  Respiratory: Negative for cough and shortness of breath.   Cardiovascular: Negative for chest pain and palpitations.  Gastrointestinal: Negative for abdominal pain and vomiting.  Genitourinary: Negative for dysuria and hematuria.  Musculoskeletal: Negative for arthralgias and back pain.  Skin: Negative for color change and rash.  Neurological: Positive for dizziness. Negative for seizures and syncope.  All other systems reviewed and are negative.   Physical Exam Updated Vital Signs BP (!) 217/54 (BP Location: Right Arm)   Pulse (!) 36   Temp 98.4 F (36.9 C) (Oral)   Resp 14   Ht 5\' 11"  (1.803 m)   Wt 73.9 kg   SpO2 99%   BMI 22.73 kg/m   Physical Exam Vitals and nursing note reviewed.  Constitutional:      General: He is not in acute distress.    Appearance: He is well-developed. He is not ill-appearing.  HENT:     Head: Normocephalic and atraumatic.     Mouth/Throat:     Mouth: Mucous membranes are moist.  Eyes:     Extraocular Movements: Extraocular movements intact.     Conjunctiva/sclera: Conjunctivae normal.     Pupils: Pupils are equal, round, and reactive to light.  Cardiovascular:     Rate and Rhythm: Bradycardia present. Rhythm irregular.     Pulses: Normal pulses.     Heart sounds: No murmur heard.   Pulmonary:     Effort: Pulmonary effort is normal. No respiratory distress.     Breath sounds: Normal breath sounds.  Abdominal:     Palpations: Abdomen is soft.     Tenderness: There is no abdominal tenderness.  Musculoskeletal:     Cervical  back: Normal range of motion and neck supple.  Skin:    General: Skin is warm and dry.     Capillary Refill: Capillary refill takes less than 2 seconds.  Neurological:     General: No focal deficit present.     Mental Status: He is alert.     ED Results / Procedures / Treatments   Labs (all labs ordered are listed, but only abnormal results are displayed) Labs Reviewed  RESP PANEL BY RT-PCR (FLU A&B, COVID) ARPGX2  CBC WITH DIFFERENTIAL/PLATELET  COMPREHENSIVE METABOLIC PANEL  MAGNESIUM    EKG None  Radiology No results found.  Procedures .Critical Care Performed by: Lennice Sites, DO Authorized by: Lennice Sites, DO   Critical care provider statement:    Critical care time (minutes):  35   Critical care was necessary to treat or prevent imminent or life-threatening deterioration of the following conditions:  Cardiac failure   Critical care was time spent personally by me on the following activities:  Blood draw for specimens, development of treatment plan with patient or surrogate, discussions with primary provider, evaluation of patient's response to treatment, examination of patient, obtaining history from patient or surrogate, ordering and performing treatments and interventions, ordering and review of laboratory studies, ordering and review of radiographic studies, pulse oximetry and review of old charts   I assumed direction of critical care for this patient from another provider in my specialty: no       Medications Ordered in ED Medications - No data to display  ED Course  I have reviewed the triage vital signs and the nursing notes.  Pertinent labs & imaging results that were available during my care of the patient were reviewed by me and considered in my medical decision making (see chart for details).    MDM Rules/Calculators/A&P                          John Wiggins is here from cardiology clinic with high degree heart block.  Bradycardic in the  30s.  EKG shows high degree heart block.  Patient is actually hypertensive.  Asymptomatic currently.  Plan is for cardiology to admit and place pacemaker.  Not having any chest pain or shortness of breath.  Will get screening labs and COVID test and admit to cardiology for further care.  This chart was dictated using voice recognition software.  Despite best efforts to proofread,  errors can occur which can change the documentation meaning.   Final Clinical Impression(s) / ED Diagnoses Final diagnoses:  Heart block    Rx / DC Orders ED Discharge Orders    None       Lennice Sites, DO 11/05/20 1531

## 2020-11-06 ENCOUNTER — Encounter (HOSPITAL_COMMUNITY)
Admission: EM | Disposition: A | Discharge status: Home / Self Care | Payer: Self-pay | Source: Home / Self Care | Attending: Cardiology

## 2020-11-06 DIAGNOSIS — I441 Atrioventricular block, second degree: Principal | ICD-10-CM

## 2020-11-06 HISTORY — PX: PACEMAKER IMPLANT: EP1218

## 2020-11-06 LAB — CBC
HCT: 36.5 % — ABNORMAL LOW (ref 39.0–52.0)
Hemoglobin: 12.1 g/dL — ABNORMAL LOW (ref 13.0–17.0)
MCH: 28.4 pg (ref 26.0–34.0)
MCHC: 33.2 g/dL (ref 30.0–36.0)
MCV: 85.7 fL (ref 80.0–100.0)
Platelets: 231 10*3/uL (ref 150–400)
RBC: 4.26 MIL/uL (ref 4.22–5.81)
RDW: 14.8 % (ref 11.5–15.5)
WBC: 15.3 10*3/uL — ABNORMAL HIGH (ref 4.0–10.5)
nRBC: 0 % (ref 0.0–0.2)

## 2020-11-06 LAB — BASIC METABOLIC PANEL
Anion gap: 3 — ABNORMAL LOW (ref 5–15)
BUN: 27 mg/dL — ABNORMAL HIGH (ref 8–23)
CO2: 29 mmol/L (ref 22–32)
Calcium: 9.2 mg/dL (ref 8.9–10.3)
Chloride: 105 mmol/L (ref 98–111)
Creatinine, Ser: 1.09 mg/dL (ref 0.61–1.24)
GFR, Estimated: >60 mL/min (ref >60)
Glucose, Bld: 103 mg/dL — ABNORMAL HIGH (ref 70–99)
Potassium: 4.5 mmol/L (ref 3.5–5.1)
Sodium: 137 mmol/L (ref 135–145)

## 2020-11-06 LAB — TSH: TSH: 1.246 u[IU]/mL (ref 0.350–4.500)

## 2020-11-06 LAB — MAGNESIUM: Magnesium: 1.9 mg/dL (ref 1.7–2.4)

## 2020-11-06 LAB — GLUCOSE, CAPILLARY: Glucose-Capillary: 97 mg/dL (ref 70–99)

## 2020-11-06 SURGERY — PACEMAKER IMPLANT

## 2020-11-06 MED ORDER — ATORVASTATIN CALCIUM 80 MG PO TABS
80.0000 mg | ORAL_TABLET | Freq: Every day | ORAL | Status: DC
  Administered 2020-11-06: 80 mg via ORAL
  Filled 2020-11-06: qty 1

## 2020-11-06 MED ORDER — ONDANSETRON HCL 4 MG/2ML IJ SOLN: 4.0000 mg | Freq: Four times a day (QID) | INTRAMUSCULAR | Status: DC | PRN

## 2020-11-06 MED ORDER — LIDOCAINE HCL (PF) 1 % IJ SOLN
INTRAMUSCULAR | Status: AC
  Filled 2020-11-06: qty 30

## 2020-11-06 MED ORDER — HEPARIN (PORCINE) IN NACL 1000-0.9 UT/500ML-% IV SOLN
INTRAVENOUS | Status: AC
  Filled 2020-11-06: qty 500

## 2020-11-06 MED ORDER — LIDOCAINE HCL (PF) 1 % IJ SOLN
INTRAMUSCULAR | Status: DC | PRN
  Administered 2020-11-06: 45 mL

## 2020-11-06 MED ORDER — CEFAZOLIN SODIUM-DEXTROSE 2-4 GM/100ML-% IV SOLN
INTRAVENOUS | Status: AC
  Filled 2020-11-06: qty 100

## 2020-11-06 MED ORDER — HEPARIN (PORCINE) IN NACL 1000-0.9 UT/500ML-% IV SOLN
INTRAVENOUS | Status: DC | PRN
  Administered 2020-11-06: 500 mL

## 2020-11-06 MED ORDER — CEFAZOLIN SODIUM-DEXTROSE 1-4 GM/50ML-% IV SOLN
1.0000 g | Freq: Four times a day (QID) | INTRAVENOUS | Status: AC
  Administered 2020-11-06 – 2020-11-07 (×3): 1 g via INTRAVENOUS
  Filled 2020-11-06 (×3): qty 50

## 2020-11-06 MED ORDER — SODIUM CHLORIDE 0.9 % IV SOLN
INTRAVENOUS | Status: AC
  Filled 2020-11-06: qty 2

## 2020-11-06 MED ORDER — ACETAMINOPHEN 325 MG PO TABS
325.0000 mg | ORAL_TABLET | ORAL | Status: DC | PRN
Start: 2020-11-06 — End: 2020-11-07
  Administered 2020-11-06 – 2020-11-07 (×3): 650 mg via ORAL
  Filled 2020-11-06 (×3): qty 2

## 2020-11-06 MED ORDER — LIDOCAINE HCL (PF) 1 % IJ SOLN
INTRAMUSCULAR | Status: AC
  Filled 2020-11-06: qty 60

## 2020-11-06 SURGICAL SUPPLY — 7 items
CABLE SURGICAL S-101-97-12 (CABLE) ×2 IMPLANT
LEAD TENDRIL MRI 52CM LPA1200M (Lead) ×2 IMPLANT
LEAD TENDRIL MRI 58CM LPA1200M (Lead) ×2 IMPLANT
PACEMAKER ASSURITY DR-RF (Pacemaker) ×2 IMPLANT
PAD PRO RADIOLUCENT 2001M-C (PAD) ×2 IMPLANT
SHEATH 8FR PRELUDE SNAP 13 (SHEATH) ×4 IMPLANT
TRAY PACEMAKER INSERTION (PACKS) ×2 IMPLANT

## 2020-11-06 NOTE — Discharge Summary (Addendum)
ELECTROPHYSIOLOGY PROCEDURE DISCHARGE SUMMARY    Patient ID: John Wiggins,  MRN: [id number], DOB/AGE: 03-Feb-1941 80 y.o.  Admit date: 11/05/2020 Discharge date: 11/07/2020  Primary Care Physician: Nicoletta Dress, MD  Primary Cardiologist: None  Electrophysiologist: New to Dr. Curt Bears  Primary Discharge Diagnosis:  Symptomatic bradycardia due to CHB status post pacemaker implantation this admission  Secondary Discharge Diagnosis:  HTN Tobacco abuse  No Known Allergies   Procedures This Admission:  1.  Implantation of a St. Jude dual chamber PPM on 11/06/20 by Dr. Curt Bears. The patient received a St. Jude model number [id number] PPM with model number [id number] right atrial lead and [id number] right ventricular lead. There were no immediate post procedure complications. 2.  CXR on 11/07/20 demonstrated no pneumothorax status post device implantation.   Brief HPI: John Wiggins is a 80 y.o. male was admitted for CHB and electrophysiology team asked to see for consideration of PPM implantation.  Past medical history includes above.  The patient has had symptomatic bradycardia without reversible causes identified.  Risks, benefits, and alternatives to PPM implantation were reviewed with the patient who wished to proceed.   Hospital Course:  The patient was admitted and underwent implantation of a St. Jude dual chamber PPM with details as outlined above.  He was monitored on telemetry overnight which demonstrated appropriate pacing.  Left chest was without hematoma or ecchymosis.  The device was interrogated and found to be functioning normally.  CXR was obtained and demonstrated no pneumothorax status post device implantation.  Wound care, arm mobility, and restrictions were reviewed with the patient.  The patient was examined and considered stable for discharge to home.    Patient is not on [place]. Only baby ASA.  Physical Exam: Vitals:   11/06/20 2046 11/06/20 2331 11/07/20 0359  11/07/20 0743  BP: (!) 215/67 (!) 187/63 (!) 189/87   Pulse: 61 (!) 58 (!) 45   Resp: 16 16 18    Temp: 98 F (36.7 C) 98 F (36.7 C) 97.9 F (36.6 C)   TempSrc: Oral Oral Oral   SpO2: 97% 95% 93% 94%  Weight:   68.7 kg   Height:        GEN- The patient is well appearing, alert and oriented x 3 today.   HEENT: normocephalic, atraumatic; sclera clear, conjunctiva pink; hearing intact; oropharynx clear; neck supple, no JVP Lymph- no cervical lymphadenopathy Lungs- Clear to ausculation bilaterally, normal work of breathing.  No wheezes, rales, rhonchi Heart- Regular rate and rhythm, no murmurs, rubs or gallops, PMI not laterally displaced GI- soft, non-tender, non-distended, bowel sounds present, no hepatosplenomegaly Extremities- no clubbing, cyanosis, or edema; DP/PT/radial pulses 2+ bilaterally MS- no significant deformity or atrophy Skin- warm and dry, no rash or lesion, left chest without hematoma/ecchymosis Psych- euthymic mood, full affect Neuro- strength and sensation are intact   Labs:   Lab Results  Component Value Date   WBC 15.3 (H) 11/06/2020   HGB 12.1 (L) 11/06/2020   HCT 36.5 (L) 11/06/2020   MCV 85.7 11/06/2020   PLT 231 11/06/2020    Recent Labs  Lab 11/05/20 1515 11/06/20 0417  NA 136 137  K 4.8 4.5  CL 102 105  CO2 26 29  BUN 31* 27*  CREATININE 1.15 1.09  CALCIUM 9.7 9.2  PROT 6.0*  --   BILITOT 0.2*  --   ALKPHOS 53  --   ALT 34  --   AST 19  --   GLUCOSE 124* 103*  Discharge Medications:  Allergies as of 11/07/2020   No Known Allergies      Medication List     TAKE these medications    acetaminophen 325 MG tablet Commonly known as: TYLENOL Take 1-2 tablets (325-650 mg total) by mouth every 4 (four) hours as needed for mild pain.   aspirin EC 81 MG tablet Take 81 mg by mouth daily.   atorvastatin 80 MG tablet Commonly known as: LIPITOR Take 80 mg by mouth daily.   cetirizine 10 MG tablet Commonly known as: ZYRTEC Take  10 mg by mouth daily.   predniSONE 10 MG tablet Commonly known as: DELTASONE Take 10 mg by mouth daily with breakfast.   ProAir HFA 108 (90 Base) MCG/ACT inhaler Generic drug: albuterol Inhale 2 puffs into the lungs 4 (four) times daily as needed for wheezing or shortness of breath.   Trelegy Ellipta 100-62.5-25 MCG/INH Aepb Generic drug: Fluticasone-Umeclidin-Vilant Inhale 1 puff into the lungs daily.   valsartan 80 MG tablet Commonly known as: DIOVAN Take 80 mg by mouth daily.        Disposition:     Follow-up Information     [facility] Follow up.   Why: on 5/17 at 320 pm for post pacemaker wound check Contact information: [place] [address] [phone]        Nicoletta Dress, MD.   Specialty: Internal Medicine Contact information: [place] [address] [phone]                 Duration of Discharge Encounter: Greater than 30 minutes including physician time.  Signed, Shirley Friar, PA-C  11/07/2020 8:28 AM  I have seen and examined this patient with Oda Kilts.  Agree with above, note added to reflect my findings.  On exam, RRR, no murmurs.  She is now status post St. Jude pacemaker for second degree AV block.  Device functioning appropriately.  Chest x-ray and interrogation without issue.  Plan for discharge today with follow-up in device clinic.  Colleen Kotlarz M. Loyd Salvador MD 11/07/2020 8:32 AM

## 2020-11-06 NOTE — Progress Notes (Addendum)
Electrophysiology Rounding Note  Patient Name: John Wiggins Date of Encounter: 11/06/2020  Primary Cardiologist: None Electrophysiologist: New   Subjective   The patient is doing well today.  At this time, the patient denies chest pain, shortness of breath, or any new concerns.  Remains in intermittent CHB with rates in 30-40s  Inpatient Medications    Scheduled Meds:  atorvastatin  80 mg Oral Daily   chlorhexidine  60 mL Topical Once   fluticasone furoate-vilanterol  1 puff Inhalation Daily   And   umeclidinium bromide  1 puff Inhalation Daily   gentamicin irrigation  80 mg Irrigation On Call   loratadine  10 mg Oral Daily   Continuous Infusions:  sodium chloride 50 mL/hr at 11/06/20 0619   sodium chloride      ceFAZolin (ANCEF) IV     PRN Meds: albuterol   Vital Signs    Vitals:   11/06/20 0339 11/06/20 0620 11/06/20 0738 11/06/20 0812  BP: (!) 184/56 (!) 192/75 (!) 186/57   Pulse: (!) 38 (!) 36 (!) 35   Resp: 16 13 18 16   Temp: 97.9 F (36.6 C) 98.2 F (36.8 C) 97.9 F (36.6 C)   TempSrc: Oral Oral Oral   SpO2: 97% 96% 97% 96%  Weight: 70.2 kg     Height:        Intake/Output Summary (Last 24 hours) at 11/06/2020 0838 Last data filed at 11/06/2020 0544 Gross per 24 hour  Intake 240 ml  Output 1325 ml  Net -1085 ml   Filed Weights   11/05/20 1501 11/06/20 0339  Weight: 73.9 kg 70.2 kg    Physical Exam    GEN- The patient is well appearing, alert and oriented x 3 today.   Head- normocephalic, atraumatic Eyes-  Sclera clear, conjunctiva pink Ears- hearing intact Oropharynx- clear Neck- supple Lungs- Clear to ausculation bilaterally, normal work of breathing Heart- Slow and somewhat irregular, no murmurs, rubs or gallops GI- soft, NT, ND, + BS Extremities- no clubbing or cyanosis. No edema Skin- no rash or lesion Psych- euthymic mood, full affect Neuro- strength and sensation are intact  Labs    CBC Recent Labs    11/05/20 1515  11/06/20 0423  WBC 17.7* 15.3*  NEUTROABS 12.5*  --   HGB 12.7* 12.1*  HCT 38.9* 36.5*  MCV 86.6 85.7  PLT 243 161   Basic Metabolic Panel Recent Labs    11/05/20 1515 11/06/20 0417  NA 136 137  K 4.8 4.5  CL 102 105  CO2 26 29  GLUCOSE 124* 103*  BUN 31* 27*  CREATININE 1.15 1.09  CALCIUM 9.7 9.2  MG 1.9 1.9   Liver Function Tests Recent Labs    11/05/20 1515  AST 19  ALT 34  ALKPHOS 53  BILITOT 0.2*  PROT 6.0*  ALBUMIN 3.1*   No results for input(s): LIPASE, AMYLASE in the last 72 hours. Cardiac Enzymes No results for input(s): CKTOTAL, CKMB, CKMBINDEX, TROPONINI in the last 72 hours.   Telemetry    CHB with intermittent 2:1 block, HR 30-40s (personally reviewed)  Radiology    DG Chest Portable 1 View  Result Date: 11/05/2020 CLINICAL DATA:  Shortness of breath EXAM: PORTABLE CHEST 1 VIEW COMPARISON:  11/01/2020, CT 08/18/2017 FINDINGS: Numerous bilateral pleural plaques. No consolidation or effusion. Borderline cardiomegaly with aortic atherosclerosis. No pneumothorax. IMPRESSION: No active disease.  Extensive bilateral pleural plaques. Electronically Signed   By: Donavan Foil M.D.   On: 11/05/2020 16:15  ECHOCARDIOGRAM COMPLETE  Result Date: 11/05/2020    ECHOCARDIOGRAM REPORT   Patient Name:   John Wiggins Date of Exam: 11/05/2020 Medical Rec #:  [id number]        Height:       71.0 in Accession #:    [id number]       Weight:       163.0 lb Date of Birth:  08/19/1940        BSA:          1.933 m Patient Age:    45 years         BP:           216/74 mmHg Patient Gender: M                HR:           38 bpm. Exam Location:  Inpatient Procedure: 2D Echo, Cardiac Doppler and Color Doppler Indications:    Heart block. Pacemaker planned  History:        Patient has no prior history of Echocardiogram examinations,                 most recent 02/15/2016 is a Carotid Study. COPD; Risk                 Factors:Current Smoker. Heavy coffee drinker.  Sonographer:     Merrie Roof Referring Phys: [id number] [address]  1. Left ventricular ejection fraction, by estimation, is 55 to 60%. The left ventricle has normal function. The left ventricle has no regional wall motion abnormalities. There is severe concentric left ventricular hypertrophy. Left ventricular diastolic  parameters are indeterminate.  2. The mitral valve is degenerative. Mild to moderate mitral valve regurgitation. There is both eccentric systolic mitral regurgitation and diastolic mitral valve regurgitation that varies based on the degree of heart block.  3. Left atrial size was mild to moderately dilated.  4. Right ventricular systolic function is low normal. The right ventricular size is normal.  5. Tricuspid valve regurgitation is moderate.  6. Right atrial size was severely dilated.  7. The aortic valve is tricuspid. There is mild calcification of the aortic valve. There is mild thickening of the aortic valve. Aortic valve regurgitation is not visualized. Mild aortic valve sclerosis is present, with no evidence of aortic valve stenosis. Comparison(s): No prior Echocardiogram. FINDINGS  Left Ventricle: Left ventricular ejection fraction, by estimation, is 55 to 60%. The left ventricle has normal function. The left ventricle has no regional wall motion abnormalities. The left ventricular internal cavity size was normal in size. There is  severe concentric left ventricular hypertrophy. Left ventricular diastolic parameters are indeterminate. Right Ventricle: The right ventricular size is normal. No increase in right ventricular wall thickness. Right ventricular systolic function is low normal. Left Atrium: Left atrial size was mild to moderately dilated. Right Atrium: Right atrial size was severely dilated. Pericardium: There is no evidence of pericardial effusion. Mitral Valve: The mitral valve is degenerative in appearance. Mild mitral annular calcification. Mild to moderate mitral valve  regurgitation. Tricuspid Valve: The tricuspid valve is grossly normal. Tricuspid valve regurgitation is moderate. Aortic Valve: The aortic valve is tricuspid. There is mild calcification of the aortic valve. There is mild thickening of the aortic valve. Aortic valve regurgitation is not visualized. Mild aortic valve sclerosis is present, with no evidence of aortic valve stenosis. Pulmonic Valve: The pulmonic valve was grossly normal. Pulmonic valve regurgitation is not visualized. No evidence of  pulmonic stenosis. Aorta: The aortic root and ascending aorta are structurally normal, with no evidence of dilitation. IAS/Shunts: The atrial septum is grossly normal.  LEFT VENTRICLE PLAX 2D LVIDd:         3.70 cm Diastology LVIDs:         2.40 cm LV e' medial:    6.85 cm/s LV PW:         1.50 cm LV E/e' medial:  23.4 LV IVS:        1.70 cm LV e' lateral:   7.18 cm/s                        LV E/e' lateral: 22.3  RIGHT VENTRICLE RV Basal diam:  4.20 cm RV Mid diam:    3.60 cm LEFT ATRIUM             Index       RIGHT ATRIUM           Index LA diam:        4.80 cm 2.48 cm/m  RA Area:     25.50 cm LA Vol (A2C):   89.6 ml 46.35 ml/m RA Volume:   97.80 ml  50.60 ml/m LA Vol (A4C):   66.2 ml 34.25 ml/m LA Biplane Vol: 80.5 ml 41.65 ml/m  AORTIC VALVE LVOT Vmax:   133.00 cm/s LVOT Vmean:  85.000 cm/s LVOT VTI:    0.339 m  AORTA Ao Root diam: 2.90 cm Ao Asc diam:  3.30 cm MITRAL VALVE                TRICUSPID VALVE MV Area (PHT): 2.02 cm     TR Peak grad:   43.6 mmHg MV Decel Time: 376 msec     TR Vmax:        330.00 cm/s MV E velocity: 160.00 cm/s MV A velocity: 128.00 cm/s  SHUNTS MV E/A ratio:  1.25         Systemic VTI: 0.34 m Rudean Haskell MD Electronically signed by Rudean Haskell MD Signature Date/Time: 11/05/2020/7:53:58 PM    Final     Patient Profile     John Wiggins is a 80 y.o. male with a history of COPD, carotid artery disease, and tobacco abuse who is being seen today for the evaluation of  symptomatic bradycardia at the request of Dr. Agustin Cree.  Assessment & Plan    1.  Symptomatic bradycardia due to advanced AV block with at least 2:1 block, Mobitz II. ? Intermittent CHB Echo with LVEF 50-55% Labs unremarkable COVID test negative Explained risks, benefits, and alternatives to PPM implantation, including but not limited to bleeding, infection, pneumothorax, pericardial effusion, lead dislodgement, heart attack, stroke, or death.  Pt verbalized understanding and agrees to proceed 2. HTN Exacerbated in setting of AV block.  Avoid AV nodal agents.    3. Tobacco Abuse Smoking 1 ppd up to this weekend. Encourage cessation.   On for pacer this afternoon.   For questions or updates, please contact [place] Please consult [email or web address] for contact info under Cardiology/STEMI.  Signed, Shirley Friar, PA-C  11/06/2020, 8:38 AM   I have seen and examined this patient with Oda Kilts.  Agree with above, note added to reflect my findings.  On exam, bradycardic, regular, no murmurs.  Patient presented to the hospital with weakness and fatigue, found to be in 2-1 AV block and intermittent complete heart block.  No reversible cause found.  We John Wiggins  plan for pacemaker implant later today.  John Wiggins has presented today for surgery, with the diagnosis of second degree AV block.  The various methods of treatment have been discussed with the patient and family. After consideration of risks, benefits and other options for treatment, the patient has consented to  Procedure(s): Pacemaker implant as a surgical intervention .  Risks include but not limited to bleeding, infection, pneumothorax, perforation, tamponade, vascular damage, renal failure, MI, stroke, death, and lead dislodgement . The patient's history has been reviewed, patient examined, no change in status, stable for surgery.  I have reviewed the patient's chart and labs.  Questions were answered to the patient's  satisfaction.    John Wiggins Curt Bears, MD 11/06/2020 11:45 AM

## 2020-11-06 NOTE — Interval H&P Note (Signed)
History and Physical Interval Note:  11/06/2020 5:28 PM  John Wiggins  has presented today for surgery, with the diagnosis of heart block.  The various methods of treatment have been discussed with the patient and family. After consideration of risks, benefits and other options for treatment, the patient has consented to  Procedure(s): PACEMAKER IMPLANT (N/A) as a surgical intervention.  The patient's history has been reviewed, patient examined, no change in status, stable for surgery.  I have reviewed the patient's chart and labs.  Questions were answered to the patient's satisfaction.     Rosalea Withrow [employer]

## 2020-11-06 NOTE — Progress Notes (Signed)
   11/06/20 0620  Vitals  Temp 98.2 F (36.8 C)  Temp Source Oral  BP (!) 192/75  MAP (mmHg) 106  BP Location Left Arm  BP Method Automatic  Pulse Rate (!) 36  ECG Heart Rate (!) 35  Resp 13  Level of Consciousness  Level of Consciousness Alert  MEWS COLOR  MEWS Score Color Yellow  Oxygen Therapy  SpO2 96 %  O2 Device Room Air  MEWS Score  MEWS Temp 0  MEWS Systolic 0  MEWS Pulse 2  MEWS RR 1  MEWS LOC 0  MEWS Score 3  Pt's blood pressure elevated, HR in the 30's sustained, pt asymptomatic, denies pain. Cardiology on call made aware.

## 2020-11-06 NOTE — H&P (View-Only) (Signed)
Electrophysiology Rounding Note  Patient Name: John Wiggins Date of Encounter: 11/06/2020  Primary Cardiologist: None Electrophysiologist: New   Subjective   The patient is doing well today.  At this time, the patient denies chest pain, shortness of breath, or any new concerns.  Remains in intermittent CHB with rates in 30-40s  Inpatient Medications    Scheduled Meds:  atorvastatin  80 mg Oral Daily   chlorhexidine  60 mL Topical Once   fluticasone furoate-vilanterol  1 puff Inhalation Daily   And   umeclidinium bromide  1 puff Inhalation Daily   gentamicin irrigation  80 mg Irrigation On Call   loratadine  10 mg Oral Daily   Continuous Infusions:  sodium chloride 50 mL/hr at 11/06/20 0619   sodium chloride      ceFAZolin (ANCEF) IV     PRN Meds: albuterol   Vital Signs    Vitals:   11/06/20 0339 11/06/20 0620 11/06/20 0738 11/06/20 0812  BP: (!) 184/56 (!) 192/75 (!) 186/57   Pulse: (!) 38 (!) 36 (!) 35   Resp: 16 13 18 16   Temp: 97.9 F (36.6 C) 98.2 F (36.8 C) 97.9 F (36.6 C)   TempSrc: Oral Oral Oral   SpO2: 97% 96% 97% 96%  Weight: 70.2 kg     Height:        Intake/Output Summary (Last 24 hours) at 11/06/2020 0838 Last data filed at 11/06/2020 0544 Gross per 24 hour  Intake 240 ml  Output 1325 ml  Net -1085 ml   Filed Weights   11/05/20 1501 11/06/20 0339  Weight: 73.9 kg 70.2 kg    Physical Exam    GEN- The patient is well appearing, alert and oriented x 3 today.   Head- normocephalic, atraumatic Eyes-  Sclera clear, conjunctiva pink Ears- hearing intact Oropharynx- clear Neck- supple Lungs- Clear to ausculation bilaterally, normal work of breathing Heart- Slow and somewhat irregular, no murmurs, rubs or gallops GI- soft, NT, ND, + BS Extremities- no clubbing or cyanosis. No edema Skin- no rash or lesion Psych- euthymic mood, full affect Neuro- strength and sensation are intact  Labs    CBC Recent Labs    11/05/20 1515  11/06/20 0423  WBC 17.7* 15.3*  NEUTROABS 12.5*  --   HGB 12.7* 12.1*  HCT 38.9* 36.5*  MCV 86.6 85.7  PLT 243 161   Basic Metabolic Panel Recent Labs    11/05/20 1515 11/06/20 0417  NA 136 137  K 4.8 4.5  CL 102 105  CO2 26 29  GLUCOSE 124* 103*  BUN 31* 27*  CREATININE 1.15 1.09  CALCIUM 9.7 9.2  MG 1.9 1.9   Liver Function Tests Recent Labs    11/05/20 1515  AST 19  ALT 34  ALKPHOS 53  BILITOT 0.2*  PROT 6.0*  ALBUMIN 3.1*   No results for input(s): LIPASE, AMYLASE in the last 72 hours. Cardiac Enzymes No results for input(s): CKTOTAL, CKMB, CKMBINDEX, TROPONINI in the last 72 hours.   Telemetry    CHB with intermittent 2:1 block, HR 30-40s (personally reviewed)  Radiology    DG Chest Portable 1 View  Result Date: 11/05/2020 CLINICAL DATA:  Shortness of breath EXAM: PORTABLE CHEST 1 VIEW COMPARISON:  11/01/2020, CT 08/18/2017 FINDINGS: Numerous bilateral pleural plaques. No consolidation or effusion. Borderline cardiomegaly with aortic atherosclerosis. No pneumothorax. IMPRESSION: No active disease.  Extensive bilateral pleural plaques. Electronically Signed   By: Donavan Foil M.D.   On: 11/05/2020 16:15  ECHOCARDIOGRAM COMPLETE  Result Date: 11/05/2020    ECHOCARDIOGRAM REPORT   Patient Name:   John Wiggins Date of Exam: 11/05/2020 Medical Rec #:  [id number]        Height:       71.0 in Accession #:    [id number]       Weight:       163.0 lb Date of Birth:  1941/04/24        BSA:          1.933 m Patient Age:    80 years         BP:           216/74 mmHg Patient Gender: M                HR:           38 bpm. Exam Location:  Inpatient Procedure: 2D Echo, Cardiac Doppler and Color Doppler Indications:    Heart block. Pacemaker planned  History:        Patient has no prior history of Echocardiogram examinations,                 most recent 02/15/2016 is a Carotid Study. COPD; Risk                 Factors:Current Smoker. Heavy coffee drinker.  Sonographer:     Merrie Roof Referring Phys: [id number] [place]  1. Left ventricular ejection fraction, by estimation, is 55 to 60%. The left ventricle has normal function. The left ventricle has no regional wall motion abnormalities. There is severe concentric left ventricular hypertrophy. Left ventricular diastolic  parameters are indeterminate.  2. The mitral valve is degenerative. Mild to moderate mitral valve regurgitation. There is both eccentric systolic mitral regurgitation and diastolic mitral valve regurgitation that varies based on the degree of heart block.  3. Left atrial size was mild to moderately dilated.  4. Right ventricular systolic function is low normal. The right ventricular size is normal.  5. Tricuspid valve regurgitation is moderate.  6. Right atrial size was severely dilated.  7. The aortic valve is tricuspid. There is mild calcification of the aortic valve. There is mild thickening of the aortic valve. Aortic valve regurgitation is not visualized. Mild aortic valve sclerosis is present, with no evidence of aortic valve stenosis. Comparison(s): No prior Echocardiogram. FINDINGS  Left Ventricle: Left ventricular ejection fraction, by estimation, is 55 to 60%. The left ventricle has normal function. The left ventricle has no regional wall motion abnormalities. The left ventricular internal cavity size was normal in size. There is  severe concentric left ventricular hypertrophy. Left ventricular diastolic parameters are indeterminate. Right Ventricle: The right ventricular size is normal. No increase in right ventricular wall thickness. Right ventricular systolic function is low normal. Left Atrium: Left atrial size was mild to moderately dilated. Right Atrium: Right atrial size was severely dilated. Pericardium: There is no evidence of pericardial effusion. Mitral Valve: The mitral valve is degenerative in appearance. Mild mitral annular calcification. Mild to moderate mitral valve  regurgitation. Tricuspid Valve: The tricuspid valve is grossly normal. Tricuspid valve regurgitation is moderate. Aortic Valve: The aortic valve is tricuspid. There is mild calcification of the aortic valve. There is mild thickening of the aortic valve. Aortic valve regurgitation is not visualized. Mild aortic valve sclerosis is present, with no evidence of aortic valve stenosis. Pulmonic Valve: The pulmonic valve was grossly normal. Pulmonic valve regurgitation is not visualized. No evidence of  pulmonic stenosis. Aorta: The aortic root and ascending aorta are structurally normal, with no evidence of dilitation. IAS/Shunts: The atrial septum is grossly normal.  LEFT VENTRICLE PLAX 2D LVIDd:         3.70 cm Diastology LVIDs:         2.40 cm LV e' medial:    6.85 cm/s LV PW:         1.50 cm LV E/e' medial:  23.4 LV IVS:        1.70 cm LV e' lateral:   7.18 cm/s                        LV E/e' lateral: 22.3  RIGHT VENTRICLE RV Basal diam:  4.20 cm RV Mid diam:    3.60 cm LEFT ATRIUM             Index       RIGHT ATRIUM           Index LA diam:        4.80 cm 2.48 cm/m  RA Area:     25.50 cm LA Vol (A2C):   89.6 ml 46.35 ml/m RA Volume:   97.80 ml  50.60 ml/m LA Vol (A4C):   66.2 ml 34.25 ml/m LA Biplane Vol: 80.5 ml 41.65 ml/m  AORTIC VALVE LVOT Vmax:   133.00 cm/s LVOT Vmean:  85.000 cm/s LVOT VTI:    0.339 m  AORTA Ao Root diam: 2.90 cm Ao Asc diam:  3.30 cm MITRAL VALVE                TRICUSPID VALVE MV Area (PHT): 2.02 cm     TR Peak grad:   43.6 mmHg MV Decel Time: 376 msec     TR Vmax:        330.00 cm/s MV E velocity: 160.00 cm/s MV A velocity: 128.00 cm/s  SHUNTS MV E/A ratio:  1.25         Systemic VTI: 0.34 m Rudean Haskell MD Electronically signed by Rudean Haskell MD Signature Date/Time: 11/05/2020/7:53:58 PM    Final     Patient Profile     Chanler Mendonca is a 80 y.o. male with a history of COPD, carotid artery disease, and tobacco abuse who is being seen today for the evaluation of  symptomatic bradycardia at the request of Dr. Agustin Cree.  Assessment & Plan    1.  Symptomatic bradycardia due to advanced AV block with at least 2:1 block, Mobitz II. ? Intermittent CHB Echo with LVEF 50-55% Labs unremarkable COVID test negative Explained risks, benefits, and alternatives to PPM implantation, including but not limited to bleeding, infection, pneumothorax, pericardial effusion, lead dislodgement, heart attack, stroke, or death.  Pt verbalized understanding and agrees to proceed 2. HTN Exacerbated in setting of AV block.  Avoid AV nodal agents.    3. Tobacco Abuse Smoking 1 ppd up to this weekend. Encourage cessation.   On for pacer this afternoon.   For questions or updates, please contact [place] Please consult [email or web address] for contact info under Cardiology/STEMI.  Signed, Shirley Friar, PA-C  11/06/2020, 8:38 AM   I have seen and examined this patient with Oda Kilts.  Agree with above, note added to reflect my findings.  On exam, bradycardic, regular, no murmurs.  Patient presented to the hospital with weakness and fatigue, found to be in 2-1 AV block and intermittent complete heart block.  No reversible cause found.  We Moritz Lever  plan for pacemaker implant later today.  Zong Mcquarrie has presented today for surgery, with the diagnosis of second degree AV block.  The various methods of treatment have been discussed with the patient and family. After consideration of risks, benefits and other options for treatment, the patient has consented to  Procedure(s): Pacemaker implant as a surgical intervention .  Risks include but not limited to bleeding, infection, pneumothorax, perforation, tamponade, vascular damage, renal failure, MI, stroke, death, and lead dislodgement . The patient's history has been reviewed, patient examined, no change in status, stable for surgery.  I have reviewed the patient's chart and labs.  Questions were answered to the patient's  satisfaction.    Kellyjo Edgren Curt Bears, MD 11/06/2020 11:45 AM

## 2020-11-07 ENCOUNTER — Encounter (HOSPITAL_COMMUNITY): Payer: Self-pay | Admitting: Cardiology

## 2020-11-07 ENCOUNTER — Inpatient Hospital Stay (HOSPITAL_COMMUNITY): Payer: Medicare HMO

## 2020-11-07 DIAGNOSIS — J929 Pleural plaque without asbestos: Secondary | ICD-10-CM | POA: Diagnosis not present

## 2020-11-07 DIAGNOSIS — Z950 Presence of cardiac pacemaker: Secondary | ICD-10-CM | POA: Diagnosis not present

## 2020-11-07 MED ORDER — ACETAMINOPHEN 325 MG PO TABS: 325.0000 mg | ORAL_TABLET | ORAL | Status: DC | PRN

## 2020-11-07 NOTE — Progress Notes (Signed)
D/C instructions given and reviewed. Tele removed by pt. Instructed pt that he still needed last dose of IV antibiotic prior to leaving. Pt verbalized an understanding.

## 2020-11-07 NOTE — Discharge Instructions (Signed)
After Your Pacemaker   . You have a [employer] Jude Pacemaker  ACTIVITY . Do not lift your arm above shoulder height for 1 week after your procedure. After 7 days, you may progress as below.  . You should remove your sling 24 hours after your procedure, unless otherwise instructed by your provider.     Thursday Nov 14, 2020  Friday Nov 15, 2020 Saturday Nov 16, 2020 Sunday Nov 17, 2020   . Do not lift, push, pull, or carry anything over 10 pounds with the affected arm until 6 weeks (Thursday December 19, 2020 ) after your procedure.   . You may drive AFTER your wound check, unless you have been told otherwise by your provider.   . Ask your healthcare provider when you can go back to work   INCISION/Dressing . If you are on a blood thinner such as Coumadin, Xarelto, Eliquis, Plavix, or Pradaxa please confirm with your provider when this should be resumed.   . If large square, outer bandage is left in place, this can be removed after 24 hours from your procedure. Do not remove steri-strips or glue as below.   . Monitor your Pacemaker site for redness, swelling, and drainage. Call the device clinic at [phone] if you experience these symptoms or fever/chills.  . If your incision is sealed with Steri-strips or staples, you may shower 10 days after your procedure or when told by your provider. Do not remove the steri-strips or let the shower hit directly on your site. You may wash around your site with soap and water.    Marland Kitchen Avoid lotions, ointments, or perfumes over your incision until it is well-healed.  . You may use a hot tub or a pool AFTER your wound check appointment if the incision is completely closed.  Marland Kitchen PAcemaker Alerts:  Some alerts are vibratory and others beep. These are NOT emergencies. Please call our office to let us know. If this occurs at night or on weekends, it can wait until the next business day. Send a remote transmission.  . If your device is capable of reading fluid  status (for heart failure), you will be offered monthly monitoring to review this with you.   DEVICE MANAGEMENT . Remote monitoring is used to monitor your pacemaker from home. This monitoring is scheduled every 91 days by our office. It allows [place] to keep an eye on the functioning of your device to ensure it is working properly. You will routinely see your Electrophysiologist annually (more often if necessary).   . You should receive your ID card for your new device in 4-8 weeks. Keep this card with you at all times once received. Consider wearing a medical alert bracelet or necklace.  . Your Pacemaker may be MRI compatible. This will be discussed at your next office visit/wound check.  You should avoid contact with strong electric or magnetic fields.    Do not use amateur (ham) radio equipment or electric (arc) welding torches. MP3 player headphones with magnets should not be used. Some devices are safe to use if held at least 12 inches (30 cm) from your Pacemaker. These include power tools, lawn mowers, and speakers. If you are unsure if something is safe to use, ask your health care provider.   When using your cell phone, hold it to the ear that is on the opposite side from the Pacemaker. Do not leave your cell phone in a pocket over the Pacemaker.   You may safely use  electric blankets, heating pads, computers, and microwave ovens.  Call the office right away if:  You have chest pain.  You feel more short of breath than you have felt before.  You feel more light-headed than you have felt before.  Your incision starts to open up.  This information is not intended to replace advice given to you by your health care provider. Make sure you discuss any questions you have with your health care provider.

## 2020-11-07 NOTE — Plan of Care (Signed)

## 2020-11-13 DIAGNOSIS — I10 Essential (primary) hypertension: Secondary | ICD-10-CM | POA: Diagnosis not present

## 2020-11-13 DIAGNOSIS — Z720 Tobacco use: Secondary | ICD-10-CM | POA: Diagnosis not present

## 2020-11-13 DIAGNOSIS — I442 Atrioventricular block, complete: Secondary | ICD-10-CM | POA: Diagnosis not present

## 2020-11-13 DIAGNOSIS — R001 Bradycardia, unspecified: Secondary | ICD-10-CM | POA: Diagnosis not present

## 2020-11-14 DIAGNOSIS — H35033 Hypertensive retinopathy, bilateral: Secondary | ICD-10-CM | POA: Diagnosis not present

## 2020-11-15 DIAGNOSIS — I2510 Atherosclerotic heart disease of native coronary artery without angina pectoris: Secondary | ICD-10-CM | POA: Diagnosis not present

## 2020-11-15 DIAGNOSIS — J929 Pleural plaque without asbestos: Secondary | ICD-10-CM | POA: Diagnosis not present

## 2020-11-15 DIAGNOSIS — I708 Atherosclerosis of other arteries: Secondary | ICD-10-CM | POA: Diagnosis not present

## 2020-11-15 DIAGNOSIS — J432 Centrilobular emphysema: Secondary | ICD-10-CM | POA: Diagnosis not present

## 2020-11-15 DIAGNOSIS — J61 Pneumoconiosis due to asbestos and other mineral fibers: Secondary | ICD-10-CM | POA: Diagnosis not present

## 2020-11-19 ENCOUNTER — Ambulatory Visit (INDEPENDENT_AMBULATORY_CARE_PROVIDER_SITE_OTHER): Payer: Medicare HMO | Admitting: Emergency Medicine

## 2020-11-19 ENCOUNTER — Other Ambulatory Visit: Payer: Self-pay

## 2020-11-19 DIAGNOSIS — I441 Atrioventricular block, second degree: Secondary | ICD-10-CM | POA: Diagnosis not present

## 2020-11-25 NOTE — Progress Notes (Signed)
Pacemaker check in clinic. Normal device function. Thresholds, sensing, impedances consistent with previous measurements. Device programmed to maximize longevity. No mode switch or high ventricular rates noted. Device programmed at appropriate safety margins. Histogram distribution appropriate for patient activity level. Device programmed to optimize intrinsic conduction. Estimated longevity 10 years, 7 months. Patient enrolled in remote follow-up and next remote 02/06/21. Follow-up with Dr Curt Bears 02/17/21. Patient education completed.

## 2020-12-05 DIAGNOSIS — J209 Acute bronchitis, unspecified: Secondary | ICD-10-CM | POA: Diagnosis not present

## 2020-12-05 DIAGNOSIS — Z87891 Personal history of nicotine dependence: Secondary | ICD-10-CM | POA: Diagnosis not present

## 2020-12-05 DIAGNOSIS — R918 Other nonspecific abnormal finding of lung field: Secondary | ICD-10-CM | POA: Diagnosis not present

## 2020-12-05 DIAGNOSIS — J440 Chronic obstructive pulmonary disease with acute lower respiratory infection: Secondary | ICD-10-CM | POA: Diagnosis not present

## 2020-12-12 DIAGNOSIS — H353121 Nonexudative age-related macular degeneration, left eye, early dry stage: Secondary | ICD-10-CM | POA: Diagnosis not present

## 2020-12-12 DIAGNOSIS — H353211 Exudative age-related macular degeneration, right eye, with active choroidal neovascularization: Secondary | ICD-10-CM | POA: Diagnosis not present

## 2020-12-18 ENCOUNTER — Ambulatory Visit: Payer: Medicare HMO | Admitting: Pulmonary Disease

## 2020-12-18 ENCOUNTER — Encounter: Payer: Self-pay | Admitting: Pulmonary Disease

## 2020-12-18 ENCOUNTER — Other Ambulatory Visit: Payer: Self-pay

## 2020-12-18 VITALS — BP 152/70 | HR 71 | Ht 71.0 in | Wt 155.0 lb

## 2020-12-18 DIAGNOSIS — F17210 Nicotine dependence, cigarettes, uncomplicated: Secondary | ICD-10-CM | POA: Diagnosis not present

## 2020-12-18 DIAGNOSIS — R942 Abnormal results of pulmonary function studies: Secondary | ICD-10-CM | POA: Diagnosis not present

## 2020-12-18 DIAGNOSIS — R911 Solitary pulmonary nodule: Secondary | ICD-10-CM

## 2020-12-18 DIAGNOSIS — J449 Chronic obstructive pulmonary disease, unspecified: Secondary | ICD-10-CM | POA: Diagnosis not present

## 2020-12-18 DIAGNOSIS — Z716 Tobacco abuse counseling: Secondary | ICD-10-CM

## 2020-12-18 DIAGNOSIS — J920 Pleural plaque with presence of asbestos: Secondary | ICD-10-CM | POA: Diagnosis not present

## 2020-12-18 DIAGNOSIS — R69 Illness, unspecified: Secondary | ICD-10-CM | POA: Diagnosis not present

## 2020-12-18 NOTE — Progress Notes (Signed)
Smoking Cessation Counseling:   The patient's current tobacco use: currently down to 2 cigarettes per day, age 80 yo The patient was advised to quit and impact of smoking on their health.  I assessed the patient's willingness to attempt to quit. I provided methods and skills for cessation. We reviewed medication management of smoking session drugs if appropriate. Resources to help quit smoking were provided. A smoking cessation quit date was set: July 21st, 2022 Follow-up was arranged in our clinic.  The amount of time spent counseling patient was 4 mins    Garner Nash, DO [facility] 12/18/2020 10:00 AM

## 2020-12-18 NOTE — Progress Notes (Signed)
Synopsis: Referred in June 2022 for abnormal CT chest by Nicoletta Dress, MD  Subjective:   PATIENT ID: John Wiggins GENDER: male DOB: [birth year], MRN: [id number]  Chief Complaint  Patient presents with   Consult    Follow up abnormal CT results, mass, Hx COPD    This is a 80 year old gentleman, past medical history of asbestos related pleural plaques, asthma, COPD, hyperlipidemia, hypertension.  Patient had previous asbestos exposure.  Developed significant bilateral pleural plaque disease.  He had a CT scan of the chest completed on Nov 15, 2020 at [facility].  Patient was referred to pulmonary for further evaluation.  He had progression of what appears to be a new 4 x 2.6 cm left lower lobe masslike density.  There does appear to be air bronchograms through the lesion on CT scan and it does appear to be tethered to the superior aspect of the left diaphragm which is associated with a large calcified pleural-based plaque.  OV 12/19/2018: From respiratory standpoint patient doing well.  Uses Trelegy inhaler.  Unfortunately still smoking.  60+ years at his highest was at a pack a day.  Now down to 2 cigarettes/day.  He has quit in the past 1 was on Chantix.  He has been using a taper method now for the past several months.  And doing well with this.    Past Medical History:  Diagnosis Date   Allergy    Asbestos-induced pleural plaque 09/14/2017   Asthma    Carotid artery disease ([place])    a. Carotid US 8/17: RICA 50-69% // b. Neck CTA 8/17: pRICA 35%   COPD with chronic bronchitis and emphysema ([place]) 09/14/2017   History of TIA (transient ischemic attack)    02/2016; L arm weakness   Hyperlipidemia, unspecified 03/18/2016   Hypertension    Laceration of blood vessel of right ring finger 10/01/2016   Mesothelioma of both lungs [facility])    dx age 59; worked in asbestos; followed by PCP   RAD (reactive airway disease)    Snoring 03/18/2016   Tobacco dependence 03/18/2016   Type 2  diabetes mellitus with hyperlipidemia ([place])      Family History  Problem Relation Age of Onset   Hypertension Mother    Stroke Mother    Heart disease Father        CAD; s/p CABG   Skin cancer Father    Atrial fibrillation Brother    Lung cancer Brother    Bladder Cancer Brother      Past Surgical History:  Procedure Laterality Date   CATARACT EXTRACTION Bilateral    PACEMAKER IMPLANT N/A 11/06/2020   Procedure: PACEMAKER IMPLANT;  Surgeon: Constance Haw, MD;  Location: [facility];  Service: Cardiovascular;  Laterality: N/A;   SKIN GRAFT Right    Right 4th finger   TONSILLECTOMY      Social History   Socioeconomic History   Marital status: Married    Spouse name: Not on file   Number of children: Not on file   Years of education: Not on file   Highest education level: Not on file  Occupational History   Not on file  Tobacco Use   Smoking status: Every Day    Packs/day: 0.50    Years: 60.00    Pack years: 30.00    Types: Cigarettes    Last attempt to quit: 02/28/2016    Years since quitting: 4.8   Smokeless tobacco: Never   Tobacco comments:  2 or 3 cigs a day, on chantix  Vaping Use   Vaping Use: Never used  Substance and Sexual Activity   Alcohol use: No    Comment: social   Drug use: No   Sexual activity: Not on file  Other Topics Concern   Not on file  Social History Narrative   Retired   Worked maintenance for [employer]   Prior worked with [employer]   Wife - Inez Catalina (married in 2014)   2 sons   2 grandsons   Lives in [place], [place]   Social Determinants of Health   Financial Resource Strain: Not on file  Food Insecurity: Not on file  Transportation Needs: Not on file  Physical Activity: Not on file  Stress: Not on file  Social Connections: Not on file  Intimate Partner Violence: Not on file     No Known Allergies   Outpatient Medications Prior to Visit  Medication Sig Dispense Refill   acetaminophen (TYLENOL)  325 MG tablet Take 1-2 tablets (325-650 mg total) by mouth every 4 (four) hours as needed for mild pain.     aspirin EC 81 MG tablet Take 81 mg by mouth daily.     atorvastatin (LIPITOR) 80 MG tablet Take 80 mg by mouth daily.      cetirizine (ZYRTEC) 10 MG tablet Take 10 mg by mouth daily.     Fluticasone-Umeclidin-Vilant (TRELEGY ELLIPTA) 100-62.5-25 MCG/INH AEPB Inhale 1 puff into the lungs daily.     predniSONE (DELTASONE) 10 MG tablet Take 10 mg by mouth daily with breakfast.     PROAIR HFA 108 (90 Base) MCG/ACT inhaler Inhale 2 puffs into the lungs 4 (four) times daily as needed for wheezing or shortness of breath.     valsartan (DIOVAN) 80 MG tablet Take 80 mg by mouth daily.     No facility-administered medications prior to visit.    Review of Systems  Constitutional:  Negative for chills, fever, malaise/fatigue and weight loss.  HENT:  Negative for hearing loss, sore throat and tinnitus.   Eyes:  Negative for blurred vision and double vision.  Respiratory:  Positive for cough and shortness of breath. Negative for hemoptysis, sputum production, wheezing and stridor.   Cardiovascular:  Negative for chest pain, palpitations, orthopnea, leg swelling and PND.  Gastrointestinal:  Negative for abdominal pain, constipation, diarrhea, heartburn, nausea and vomiting.  Genitourinary:  Negative for dysuria, hematuria and urgency.  Musculoskeletal:  Negative for joint pain and myalgias.  Skin:  Negative for itching and rash.  Neurological:  Negative for dizziness, tingling, weakness and headaches.  Endo/Heme/Allergies:  Negative for environmental allergies. Does not bruise/bleed easily.  Psychiatric/Behavioral:  Negative for depression. The patient is not nervous/anxious and does not have insomnia.   All other systems reviewed and are negative.   Objective:  Physical Exam Vitals reviewed.  Constitutional:      General: He is not in acute distress.    Appearance: He is well-developed.   HENT:     Head: Normocephalic and atraumatic.  Eyes:     General: No scleral icterus.    Conjunctiva/sclera: Conjunctivae normal.     Pupils: Pupils are equal, round, and reactive to light.  Neck:     Vascular: No JVD.     Trachea: No tracheal deviation.  Cardiovascular:     Rate and Rhythm: Normal rate and regular rhythm.     Heart sounds: Normal heart sounds. No murmur heard. Pulmonary:     Effort: Pulmonary effort  is normal. No tachypnea, accessory muscle usage or respiratory distress.     Breath sounds: No stridor. No wheezing, rhonchi or rales.     Comments: Diminished breath sounds bilaterally no wheeze Abdominal:     General: Bowel sounds are normal. There is no distension.     Palpations: Abdomen is soft.     Tenderness: There is no abdominal tenderness.  Musculoskeletal:        General: No tenderness.     Cervical back: Neck supple.  Lymphadenopathy:     Cervical: No cervical adenopathy.  Skin:    General: Skin is warm and dry.     Capillary Refill: Capillary refill takes less than 2 seconds.     Findings: No rash.  Neurological:     Mental Status: He is alert and oriented to person, place, and time.  Psychiatric:        Behavior: Behavior normal.     Vitals:   12/18/20 0929  BP: (!) 152/70  Pulse: 71  SpO2: 99%  Weight: 155 lb (70.3 kg)  Height: 5\' 11"  (1.803 m)   99% on RA BMI Readings from Last 3 Encounters:  12/18/20 21.62 kg/m  11/07/20 21.12 kg/m  11/05/20 22.73 kg/m   Wt Readings from Last 3 Encounters:  12/18/20 155 lb (70.3 kg)  11/07/20 151 lb 7.3 oz (68.7 kg)  11/05/20 163 lb (73.9 kg)     CBC    Component Value Date/Time   WBC 15.3 (H) 11/06/2020 0423   RBC 4.26 11/06/2020 0423   HGB 12.1 (L) 11/06/2020 0423   HCT 36.5 (L) 11/06/2020 0423   PLT 231 11/06/2020 0423   MCV 85.7 11/06/2020 0423   MCH 28.4 11/06/2020 0423   MCHC 33.2 11/06/2020 0423   RDW 14.8 11/06/2020 0423   LYMPHSABS 3.3 11/05/2020 1515   MONOABS 1.2 (H)  11/05/2020 1515   EOSABS 0.1 11/05/2020 1515   BASOSABS 0.0 11/05/2020 1515      Chest Imaging: 11/15/2020 CT chest: Ill-defined infiltrate within the left lower lobe tethered to the superior aspect of the left dome of the diaphragm with an associated thick calcified pleural plaque.  Unclear if this represents an inflammatory infiltrate.  Or potential for underlying malignancy. The patient's images have been independently reviewed by me.      Pulmonary Functions Testing Results: PFT Results Latest Ref Rng & Units 09/14/2017  FVC-Pre L 2.94  FVC-Predicted Pre % 66  FVC-Post L 3.38  FVC-Predicted Post % 76  Pre FEV1/FVC % % 58  Post FEV1/FCV % % 62  FEV1-Pre L 1.71  FEV1-Predicted Pre % 53  FEV1-Post L 2.10  DLCO uncorrected ml/min/mmHg 21.97  DLCO UNC% % 65  DLCO corrected ml/min/mmHg 22.03  DLCO COR %Predicted % 65  DLVA Predicted % 94  TLC L 6.34  TLC % Predicted % 87  RV % Predicted % 119    FeNO:   Pathology:   Echocardiogram:   Heart Catheterization:     Assessment & Plan:     ICD-10-CM   1. Asbestos-induced pleural plaque  J92.0 NM PET Image Initial (PI) Skull Base To Thigh    2. Stage 2 moderate COPD by GOLD classification ([place])  J44.9 NM PET Image Initial (PI) Skull Base To Thigh    3. Decreased diffusion capacity of lung  R94.2     4. Encounter for smoking cessation counseling  Z71.6 NM PET Image Initial (PI) Skull Base To Thigh    5. Lung nodule  R91.1  NM PET Image Initial (PI) Skull Base To Thigh      Discussion: This is a 80 year old gentleman longstanding history of smoking, previous asbestos exposure.  CT scan with significant diffuse pleural plaques noted.  He has been followed in the past for these pleural plaques which had all been stable.  And counseled on smoking cessation.  He currently has stage II COPD on Trelegy plus as needed albuterol.  From respiratory standpoint he has been stable.  Plan: Please see separate documentation regarding  smoking cessation. We discussed various methods today in the office. Quit date set for July 21  As per the patient's lower lobe lung nodule and longstanding history of smoking.  It is subsolid in nature it is also associated to just superior to the diaphragm with a large pleural-based plaque.  These areas within the lung and associated with calcified plaques are likely to represent rounded atelectasis.  However due to the patient's significant smoking history I think a PET scan would help [place] determine whether or not the lesion that is identified is concerning for malignancy. Either way will need short-term follow-up.  We will order a nuclear medicine pet image and have him return to the office in a few weeks to discuss results and next steps.  Patient is agreeable to this plan.  We reviewed images in PACS system today from [facility].     Current Outpatient Medications:    acetaminophen (TYLENOL) 325 MG tablet, Take 1-2 tablets (325-650 mg total) by mouth every 4 (four) hours as needed for mild pain., Disp: , Rfl:    aspirin EC 81 MG tablet, Take 81 mg by mouth daily., Disp: , Rfl:    atorvastatin (LIPITOR) 80 MG tablet, Take 80 mg by mouth daily. , Disp: , Rfl:    cetirizine (ZYRTEC) 10 MG tablet, Take 10 mg by mouth daily., Disp: , Rfl:    Fluticasone-Umeclidin-Vilant (TRELEGY ELLIPTA) 100-62.5-25 MCG/INH AEPB, Inhale 1 puff into the lungs daily., Disp: , Rfl:    predniSONE (DELTASONE) 10 MG tablet, Take 10 mg by mouth daily with breakfast., Disp: , Rfl:    PROAIR HFA 108 (90 Base) MCG/ACT inhaler, Inhale 2 puffs into the lungs 4 (four) times daily as needed for wheezing or shortness of breath., Disp: , Rfl:    valsartan (DIOVAN) 80 MG tablet, Take 80 mg by mouth daily., Disp: , Rfl:    Garner Nash, DO [facility] 12/18/2020 9:53 AM

## 2020-12-18 NOTE — Patient Instructions (Signed)
Thank you for visiting Dr. Valeta Harms at [facility]. Today we recommend the following:  Orders Placed This Encounter  Procedures   NM PET Image Initial (PI) Skull Base To Thigh   PET scan next available.   Return in about 3 months (around 03/20/2021) for with APP or Dr. Valeta Harms.    Please do your part to reduce the spread of COVID-19.

## 2021-01-02 ENCOUNTER — Ambulatory Visit (HOSPITAL_COMMUNITY)
Admission: RE | Admit: 2021-01-02 | Discharge: 2021-01-02 | Disposition: A | Discharge status: Home / Self Care | Payer: Medicare HMO | Source: Ambulatory Visit | Attending: Pulmonary Disease | Admitting: Pulmonary Disease

## 2021-01-02 ENCOUNTER — Encounter (HOSPITAL_COMMUNITY): Payer: Medicare HMO

## 2021-01-02 ENCOUNTER — Other Ambulatory Visit: Payer: Self-pay

## 2021-01-02 DIAGNOSIS — Z716 Tobacco abuse counseling: Secondary | ICD-10-CM | POA: Diagnosis not present

## 2021-01-02 DIAGNOSIS — R911 Solitary pulmonary nodule: Secondary | ICD-10-CM | POA: Insufficient documentation

## 2021-01-02 DIAGNOSIS — J449 Chronic obstructive pulmonary disease, unspecified: Secondary | ICD-10-CM | POA: Insufficient documentation

## 2021-01-02 DIAGNOSIS — J920 Pleural plaque with presence of asbestos: Secondary | ICD-10-CM

## 2021-01-02 LAB — GLUCOSE, CAPILLARY: Glucose-Capillary: 101 mg/dL — ABNORMAL HIGH (ref 70–99)

## 2021-01-02 MED ORDER — FLUDEOXYGLUCOSE F - 18 (FDG) INJECTION
7.8000 | Freq: Once | INTRAVENOUS | Status: AC | PRN
  Administered 2021-01-02: 7.72 via INTRAVENOUS

## 2021-01-08 ENCOUNTER — Telehealth: Payer: Self-pay | Admitting: Pulmonary Disease

## 2021-01-08 NOTE — Telephone Encounter (Signed)
Call returned to patient, confirmed DOB. Aware we would get this message to his provider and get back with him. Voiced understanding. Patient aware BI will not return to clinic until tomorrow.   BI please advise on PET results. Thanks :)

## 2021-01-09 DIAGNOSIS — H26492 Other secondary cataract, left eye: Secondary | ICD-10-CM | POA: Diagnosis not present

## 2021-01-09 DIAGNOSIS — H353211 Exudative age-related macular degeneration, right eye, with active choroidal neovascularization: Secondary | ICD-10-CM | POA: Diagnosis not present

## 2021-01-09 DIAGNOSIS — H353121 Nonexudative age-related macular degeneration, left eye, early dry stage: Secondary | ICD-10-CM | POA: Diagnosis not present

## 2021-01-09 NOTE — Telephone Encounter (Signed)
Call returned to patient wife, confirmed DOB. Made aware of results. Aware to F/U with PCP regarding colon lesion. Copy of scan routed to PCP Delena Bali.   Nothing further needed at this time.

## 2021-01-30 ENCOUNTER — Ambulatory Visit: Payer: Medicare HMO | Admitting: Pulmonary Disease

## 2021-02-06 ENCOUNTER — Telehealth: Payer: Self-pay

## 2021-02-06 LAB — CUP PACEART REMOTE DEVICE CHECK
Battery Remaining Longevity: 123 mo
Battery Remaining Percentage: 95.5 %
Battery Voltage: 3.01 V
Brady Statistic AP VP Percent: 8.3 %
Brady Statistic AP VS Percent: 1 %
Brady Statistic AS VP Percent: 88 %
Brady Statistic AS VS Percent: 3.1 %
Brady Statistic RA Percent Paced: 8.3 %
Brady Statistic RV Percent Paced: 96 %
Date Time Interrogation Session: 20220804020021
Implantable Lead Implant Date: 20220504
Implantable Lead Implant Date: 20220504
Implantable Lead Location: 753859
Implantable Lead Location: 753860
Implantable Pulse Generator Implant Date: 20220504
Lead Channel Impedance Value: 400 Ohm
Lead Channel Impedance Value: 540 Ohm
Lead Channel Pacing Threshold Amplitude: 0.5 V
Lead Channel Pacing Threshold Amplitude: 0.5 V
Lead Channel Pacing Threshold Pulse Width: 0.5 ms
Lead Channel Pacing Threshold Pulse Width: 0.5 ms
Lead Channel Sensing Intrinsic Amplitude: 5 mV
Lead Channel Sensing Intrinsic Amplitude: 7.9 mV
Lead Channel Setting Pacing Amplitude: 0.75 V
Lead Channel Setting Pacing Amplitude: 1.5 V
Lead Channel Setting Pacing Pulse Width: 0.5 ms
Lead Channel Setting Sensing Sensitivity: 2 mV
Pulse Gen Model: 2272
Pulse Gen Serial Number: 3920934

## 2021-02-06 NOTE — Telephone Encounter (Signed)
"  Scheduled remote reviewed. Normal device function.   PAF, AF burden is less than one percent, longest episode was 67 minutes, most episodes were less than one minute.  ? [place], sent to triage.  Next remote in 91 days. Kathy Breach, RN, CCDS, [employer]"  Remote transmission reviewed. AT/AF burden <1%. Longest episode June 4th for 67 min. Peak A/V 640/99. 35 AMS with 25 EGMs. V rates appear controlled during episodes. No OAC as this is new onset. CHA2DS2-VASc 7. Will route to Dr. Curt Bears for review.

## 2021-02-13 DIAGNOSIS — H353211 Exudative age-related macular degeneration, right eye, with active choroidal neovascularization: Secondary | ICD-10-CM | POA: Diagnosis not present

## 2021-02-17 ENCOUNTER — Encounter: Payer: Medicare HMO | Admitting: Cardiology

## 2021-02-26 DIAGNOSIS — U099 Post covid-19 condition, unspecified: Secondary | ICD-10-CM | POA: Diagnosis not present

## 2021-02-26 DIAGNOSIS — R053 Chronic cough: Secondary | ICD-10-CM | POA: Diagnosis not present

## 2021-03-05 DIAGNOSIS — R634 Abnormal weight loss: Secondary | ICD-10-CM | POA: Diagnosis not present

## 2021-03-05 DIAGNOSIS — R948 Abnormal results of function studies of other organs and systems: Secondary | ICD-10-CM | POA: Diagnosis not present

## 2021-03-05 DIAGNOSIS — R6881 Early satiety: Secondary | ICD-10-CM | POA: Diagnosis not present

## 2021-03-05 DIAGNOSIS — K5900 Constipation, unspecified: Secondary | ICD-10-CM | POA: Diagnosis not present

## 2021-03-05 DIAGNOSIS — K219 Gastro-esophageal reflux disease without esophagitis: Secondary | ICD-10-CM | POA: Diagnosis not present

## 2021-03-20 DIAGNOSIS — H353211 Exudative age-related macular degeneration, right eye, with active choroidal neovascularization: Secondary | ICD-10-CM | POA: Diagnosis not present

## 2021-03-24 ENCOUNTER — Telehealth: Payer: Self-pay | Admitting: *Deleted

## 2021-03-24 NOTE — Telephone Encounter (Signed)
I did not request the pt to be seen in [place] office. I called the pt to confirm that he is aware he is going to see Dr. Curt Bears in the [facility] office on [address] at 2 pm 03/25/21. Pt states he is aware of [place] [address] appt. Pt states he thought Dr. Curt Bears was in [place] this afternoon and he was hoping to be seen then. I explained to the pt that Dr. Curt Bears in the [place] office today. Pt is agreeable to plan of care and thanked me for the call and making sure that they were aware of the location address as well for [place].

## 2021-03-24 NOTE — Telephone Encounter (Signed)
Pt would like to know if he could have appt moved to [place] office, please advise.

## 2021-03-24 NOTE — Telephone Encounter (Signed)
Will send to device clinic as well for clearance. Clearance request state pt has had recent pacemaker placement 11/2020.

## 2021-03-24 NOTE — Telephone Encounter (Signed)
Primary Cardiologist:Robert Agustin Cree, MD  Chart reviewed as part of pre-operative protocol coverage. Because of John Wiggins's past medical history and time since last visit, he/she will require a follow-up visit in order to better assess preoperative cardiovascular risk.  Pre-op covering staff: - Please schedule appointment and call patient to inform them. - Please contact requesting surgeon's office via preferred method (i.e, phone, fax) to inform them of need for appointment prior to surgery.  If applicable, this message will also be routed to pharmacy pool and/or primary cardiologist for input on holding anticoagulant/antiplatelet agent as requested below so that this information is available at time of patient's appointment.   Deberah Pelton, NP  03/24/2021, 12:32 PM

## 2021-03-24 NOTE — Telephone Encounter (Signed)
   [facility] Pre-operative Risk Assessment    Patient Name: John Wiggins  DOB: 06-01-1941 MRN: [id number]  HEARTCARE STAFF:  - IMPORTANT!!!!!! Under Visit Info/Reason for Call, type in Other and utilize the format Clearance MM/DD/YY or Clearance TBD. Do not use dashes or single digits. - Please review there is not already an duplicate clearance open for this procedure. - If request is for dental extraction, please clarify the # of teeth to be extracted. - If the patient is currently at the dentist's office, call Pre-Op Callback Staff (MA/nurse) to input urgent request.  - If the patient is not currently in the dentist office, please route to the Pre-Op pool.  Request for surgical clearance:  What type of surgery is being performed? COLONOSCOPY/EGD   When is this surgery scheduled? 03/27/21  What type of clearance is required (medical clearance vs. Pharmacy clearance to hold med vs. Both)? MEDICAL  Are there any medications that need to be held prior to surgery and how long? PER CLEARANCE REQUEST NO MEDS NEED TO BE HELD. PER CLEARANCE REQUEST: "IS IV ANTIBIOTICS RECOMMENDED PRIOR TO COLONOSCOPY/UPPER ENDOSCOPY?"  Practice name and name of physician performing surgery? [facility]; DR. Christia Reading MISENHEIMER  What is the office phone number? [phone]   7.   What is the office fax number? [phone]  8.   Anesthesia type (None, local, MAC, general) ? NOT LISTED    Julaine Hua 03/24/2021, 11:36 AM  _________________________________________________________________   (provider comments below)

## 2021-03-24 NOTE — Telephone Encounter (Signed)
Pt has appt tomorrow with Dr. Curt Bears for follow on ppm implant as well as pre op clearance. Will send notes to MD for appt tomorrow. Will send FYI to surgeon's office pt has appt.

## 2021-03-25 ENCOUNTER — Ambulatory Visit: Payer: Medicare HMO | Admitting: Cardiology

## 2021-03-25 ENCOUNTER — Encounter: Payer: Self-pay | Admitting: Cardiology

## 2021-03-25 ENCOUNTER — Other Ambulatory Visit: Payer: Self-pay

## 2021-03-25 VITALS — BP 88/54 | HR 101 | Ht 70.0 in | Wt 146.2 lb

## 2021-03-25 DIAGNOSIS — I442 Atrioventricular block, complete: Secondary | ICD-10-CM

## 2021-03-25 NOTE — Progress Notes (Signed)
Electrophysiology Office Note   Date:  03/25/2021   ID:  John Wiggins, DOB 1940-11-10, MRN [id number]  PCP:  John Dress, MD  Cardiologist:   Primary Electrophysiologist:  John Valdes Meredith Leeds, MD    Chief Complaint: Complete heart block   History of Present Illness: John Wiggins is a 80 y.o. male who is being seen today for the evaluation of complete heart block at the request of John Dress, MD. Presenting today for electrophysiology evaluation.  He has a history significant for hypertension, COPD, hyperlipidemia, tobacco abuse, type 2 diabetes.  He presented to the hospital May 2022 with symptomatic bradycardia, found to be in complete heart block.  He is now status post [place] dual-chamber pacemaker implanted 11/06/2020.  Today, he denies symptoms of palpitations, chest pain, shortness of breath, orthopnea, PND, lower extremity edema, claudication, dizziness, presyncope, syncope, bleeding, or neurologic sequela. The patient is tolerating medications without difficulties.  Unfortunately today, he is somewhat hypotensive and tachycardic.  He has no chest pain, but does feel dizzy.  He states that he felt well yesterday.  He has not had anything to eat or drink throughout the day today.  He does get dizzy when he stands up.  Aside from that, he has no major complaints.   Past Medical History:  Diagnosis Date   Allergy    Asbestos-induced pleural plaque 09/14/2017   Asthma    Carotid artery disease ([place])    a. Carotid US 8/17: RICA 50-69% // b. 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68; worked in asbestos; followed by PCP   RAD (reactive airway disease)    Snoring 03/18/2016    Tobacco dependence 03/18/2016   Type 2 diabetes mellitus with hyperlipidemia [facility])    Past Surgical History:  Procedure Laterality Date   CATARACT EXTRACTION Bilateral    PACEMAKER IMPLANT N/A 11/06/2020   Procedure: PACEMAKER IMPLANT;  Surgeon: Constance Haw, MD;  Location: [place] [facility];  Service: Cardiovascular;  Laterality: N/A;   SKIN GRAFT Right    Right 4th finger   TONSILLECTOMY       Current Outpatient Medications  Medication Sig Dispense Refill   acetaminophen (TYLENOL) 325 MG tablet Take 1-2 tablets (325-650 mg total) by mouth every 4 (four) hours as needed for mild pain.     aspirin EC 81 MG tablet Take 81 mg by mouth daily.     atorvastatin (LIPITOR) 80 MG tablet Take 80 mg by mouth daily.      cetirizine (ZYRTEC) 10 MG tablet Take 10 mg by mouth daily.     Fluticasone-Umeclidin-Vilant (TRELEGY ELLIPTA) 100-62.5-25 MCG/INH AEPB Inhale 1 puff into the lungs daily.     PROAIR HFA 108 (90 Base) MCG/ACT inhaler Inhale 2 puffs into the lungs 4 (four) times daily as needed for wheezing or shortness of breath.     valsartan (DIOVAN) 80 MG tablet Take 80 mg by mouth daily.     No current facility-administered medications for this visit.    Allergies:   Patient has no known allergies.   Social History:  The patient  reports that he has been smoking cigarettes. He has a 30.00 pack-year smoking  history. He has never used smokeless tobacco. He reports that he does not drink alcohol and does not use drugs.   Family History:  The patient's family history includes Atrial fibrillation in his brother; Bladder Cancer in his brother; Heart disease in his father; Hypertension in his mother; Lung cancer in his brother; Skin cancer in his father; Stroke in his mother.    ROS:  Please see the history of present illness.   Otherwise, review of systems is positive for none.   All other systems are reviewed and negative.    PHYSICAL EXAM: VS:  BP (!) 88/54   Pulse (!) 101   Ht 5'  10" (1.778 m)   Wt 146 lb 3.2 oz (66.3 kg)   SpO2 96%   BMI 20.98 kg/m  , BMI Body mass index is 20.98 kg/m. GEN: Well nourished, well developed, in no acute distress  HEENT: normal  Neck: no JVD, carotid bruits, or masses Cardiac: Tachycardic; no murmurs, rubs, or gallops,no edema  Respiratory:  clear to auscultation bilaterally, normal work of breathing GI: soft, nontender, nondistended, + BS MS: no deformity or atrophy  Skin: warm and dry, device pocket is well healed Neuro:  Strength and sensation are intact Psych: euthymic mood, full affect  EKG:  EKG is ordered today. Personal review of the ekg ordered shows sinus tachycardia, ventricular paced  Device interrogation is reviewed today in detail.  See PaceArt for details.   Recent Labs: 11/05/2020: ALT 34 11/06/2020: BUN 27; Creatinine, Ser 1.09; Hemoglobin 12.1; Magnesium 1.9; Platelets 231; Potassium 4.5; Sodium 137; TSH 1.246    Lipid Panel  No results found for: CHOL, TRIG, HDL, CHOLHDL, VLDL, LDLCALC, LDLDIRECT   Wt Readings from Last 3 Encounters:  03/25/21 146 lb 3.2 oz (66.3 kg)  12/18/20 155 lb (70.3 kg)  11/07/20 151 lb 7.3 oz (68.7 kg)      Other studies Reviewed: Additional studies/ records that were reviewed today include: TTE 11/05/20  Review of the above records today demonstrates:   1. Left ventricular ejection fraction, by estimation, is 55 to 60%. The  left ventricle has normal function. The left ventricle has no regional  wall motion abnormalities. There is severe concentric left ventricular  hypertrophy. Left ventricular diastolic   parameters are indeterminate.   2. The mitral valve is degenerative. Mild to moderate mitral valve  regurgitation. There is both eccentric systolic mitral regurgitation and  diastolic mitral valve regurgitation that varies based on the degree of  heart block.   3. Left atrial size was mild to moderately dilated.   4. Right ventricular systolic function is low normal.  The right  ventricular size is normal.   5. Tricuspid valve regurgitation is moderate.   6. Right atrial size was severely dilated.   7. The aortic valve is tricuspid. There is mild calcification of the  aortic valve. There is mild thickening of the aortic valve. Aortic valve  regurgitation is not visualized. Mild aortic valve sclerosis is present,  with no evidence of aortic valve  stenosis.  There   ASSESSMENT AND PLAN:  1.  Complete heart block: Status post [employer] dual-chamber pacemaker implanted 11/06/2020.  Device functioning appropriately.  No changes at this time.  2.  Hypertension: Fortunately, his blood pressure is low today.  I have asked him to hold his valsartan for the next few days.  He does have a procedure coming up with [facility].  They Euleta Belson need to check his blood pressure prior to  the procedure to see if he is safe for sedation.  3.  Preoperative evaluation: Patient has a colonoscopy and endoscopy upcoming.  He is hypertensive and tachycardic today in clinic, though he feels that this is because he has not been eating or drinking at all today.  We Janeane Cozart hold his Diovan tonight.  I do think that if his blood pressure issues resolved, endoscopy and colonoscopy would be reasonable.  He has a normal ejection fraction and otherwise is able to do all of his daily activities.  He would be at intermediate risk for this intermediate risk procedure.  No further cardiac testing is needed.  Case discussed with [facility] gastroenterology    Current medicines are reviewed at length with the patient today.   The patient does not have concerns regarding his medicines.  The following changes were made today:  none  Labs/ tests ordered today include:  Orders Placed This Encounter  Procedures   EKG 12-Lead     Disposition:   FU with Everson Mott 9 months  Signed, Nixon Sparr Meredith Leeds, MD  03/25/2021 2:41 PM     [facility] [address] [place] [address] [phone] (office) [phone] (fax)

## 2021-03-25 NOTE — Patient Instructions (Signed)
Medication Instructions:  Your physician recommends that you continue on your current medications as directed. Please refer to the Current Medication list given to you today.  *If you need a refill on your cardiac medications before your next appointment, please call your pharmacy*   Lab Work: None ordered   Testing/Procedures: None ordered   Follow-Up: At [facility], you and your health needs are our priority.  As part of our continuing mission to provide you with exceptional heart care, we have created designated Provider Care Teams.  These Care Teams include your primary Cardiologist (physician) and Advanced Practice Providers (APPs -  Physician Assistants and Nurse Practitioners) who all work together to provide you with the care you need, when you need it.  Your next appointment:   1 month(s)  The format for your next appointment:   In Person  Provider:   Jenne Campus, MD   Your physician recommends that you schedule a follow-up appointment in: 9 months with Dr. Curt Bears.   Thank you for choosing [facility]!!   Trinidad Curet, RN [phone]

## 2021-03-26 ENCOUNTER — Encounter: Payer: Self-pay | Admitting: Cardiology

## 2021-03-26 NOTE — Telephone Encounter (Signed)
Dr. Joie Bimler office called to check the status of the clearance

## 2021-03-26 NOTE — Progress Notes (Signed)
[facility] DEVICE PROGRAMMING   Patient Information:  Patient: John Wiggins  MRN: [id number]  Date of Birth: [birth year]      Planned Procedure:  COLONOSCOPY/EGD   Surgeon:  Tia Alert DIGESTIVE DISEASE; DR. Kyra Leyland Date of Procedure:  03/27/2021    Device Information:   Clinic EP Physician:   Dr. Allegra Lai Device Type:  Pacemaker Manufacturer and Phone #:  St. Jude/A[employer]ott: [phone] Pacemaker Dependent?:  Yes Date of Last Device Check:  03/25/2021        Normal Device Function?:  Yes     Electrophysiologist's Recommendations:   Have magnet available. Provide continuous ECG monitoring when magnet is used or reprogramming is to be performed.  Procedure may interfere with device function.  Magnet should be placed over device during procedure.  Per Device Clinic Standing Orders, Simone Curia  03/26/2021 5:47 AM

## 2021-03-27 DIAGNOSIS — C187 Malignant neoplasm of sigmoid colon: Secondary | ICD-10-CM | POA: Diagnosis not present

## 2021-03-27 DIAGNOSIS — R69 Illness, unspecified: Secondary | ICD-10-CM | POA: Diagnosis not present

## 2021-03-27 DIAGNOSIS — K3189 Other diseases of stomach and duodenum: Secondary | ICD-10-CM | POA: Diagnosis not present

## 2021-03-27 DIAGNOSIS — K635 Polyp of colon: Secondary | ICD-10-CM | POA: Diagnosis not present

## 2021-03-27 DIAGNOSIS — K219 Gastro-esophageal reflux disease without esophagitis: Secondary | ICD-10-CM | POA: Diagnosis not present

## 2021-03-27 DIAGNOSIS — K648 Other hemorrhoids: Secondary | ICD-10-CM | POA: Diagnosis not present

## 2021-03-27 DIAGNOSIS — K259 Gastric ulcer, unspecified as acute or chronic, without hemorrhage or perforation: Secondary | ICD-10-CM | POA: Diagnosis not present

## 2021-03-27 DIAGNOSIS — J449 Chronic obstructive pulmonary disease, unspecified: Secondary | ICD-10-CM | POA: Diagnosis not present

## 2021-03-27 DIAGNOSIS — D125 Benign neoplasm of sigmoid colon: Secondary | ICD-10-CM | POA: Diagnosis not present

## 2021-03-27 DIAGNOSIS — K208 Other esophagitis without bleeding: Secondary | ICD-10-CM | POA: Diagnosis not present

## 2021-03-27 DIAGNOSIS — D122 Benign neoplasm of ascending colon: Secondary | ICD-10-CM | POA: Diagnosis not present

## 2021-03-27 DIAGNOSIS — K269 Duodenal ulcer, unspecified as acute or chronic, without hemorrhage or perforation: Secondary | ICD-10-CM | POA: Diagnosis not present

## 2021-03-27 DIAGNOSIS — K319 Disease of stomach and duodenum, unspecified: Secondary | ICD-10-CM | POA: Diagnosis not present

## 2021-03-27 DIAGNOSIS — R634 Abnormal weight loss: Secondary | ICD-10-CM | POA: Diagnosis not present

## 2021-03-27 DIAGNOSIS — K449 Diaphragmatic hernia without obstruction or gangrene: Secondary | ICD-10-CM | POA: Diagnosis not present

## 2021-03-27 DIAGNOSIS — K5730 Diverticulosis of large intestine without perforation or abscess without bleeding: Secondary | ICD-10-CM | POA: Diagnosis not present

## 2021-03-27 DIAGNOSIS — K2980 Duodenitis without bleeding: Secondary | ICD-10-CM | POA: Diagnosis not present

## 2021-03-31 DIAGNOSIS — Z23 Encounter for immunization: Secondary | ICD-10-CM | POA: Diagnosis not present

## 2021-03-31 DIAGNOSIS — E785 Hyperlipidemia, unspecified: Secondary | ICD-10-CM | POA: Diagnosis not present

## 2021-03-31 DIAGNOSIS — I10 Essential (primary) hypertension: Secondary | ICD-10-CM | POA: Diagnosis not present

## 2021-03-31 DIAGNOSIS — K259 Gastric ulcer, unspecified as acute or chronic, without hemorrhage or perforation: Secondary | ICD-10-CM | POA: Diagnosis not present

## 2021-03-31 DIAGNOSIS — Z125 Encounter for screening for malignant neoplasm of prostate: Secondary | ICD-10-CM | POA: Diagnosis not present

## 2021-03-31 DIAGNOSIS — G8192 Hemiplegia, unspecified affecting left dominant side: Secondary | ICD-10-CM | POA: Diagnosis not present

## 2021-03-31 DIAGNOSIS — J449 Chronic obstructive pulmonary disease, unspecified: Secondary | ICD-10-CM | POA: Diagnosis not present

## 2021-03-31 DIAGNOSIS — E1169 Type 2 diabetes mellitus with other specified complication: Secondary | ICD-10-CM | POA: Diagnosis not present

## 2021-04-17 DIAGNOSIS — H353211 Exudative age-related macular degeneration, right eye, with active choroidal neovascularization: Secondary | ICD-10-CM | POA: Diagnosis not present

## 2021-05-06 DIAGNOSIS — I10 Essential (primary) hypertension: Secondary | ICD-10-CM | POA: Insufficient documentation

## 2021-05-06 DIAGNOSIS — T7840XA Allergy, unspecified, initial encounter: Secondary | ICD-10-CM | POA: Insufficient documentation

## 2021-05-06 DIAGNOSIS — E1169 Type 2 diabetes mellitus with other specified complication: Secondary | ICD-10-CM | POA: Insufficient documentation

## 2021-05-06 DIAGNOSIS — Z8673 Personal history of transient ischemic attack (TIA), and cerebral infarction without residual deficits: Secondary | ICD-10-CM | POA: Insufficient documentation

## 2021-05-06 DIAGNOSIS — C457 Mesothelioma of other sites: Secondary | ICD-10-CM | POA: Insufficient documentation

## 2021-05-06 DIAGNOSIS — J45909 Unspecified asthma, uncomplicated: Secondary | ICD-10-CM | POA: Insufficient documentation

## 2021-05-08 ENCOUNTER — Ambulatory Visit (INDEPENDENT_AMBULATORY_CARE_PROVIDER_SITE_OTHER): Payer: Medicare HMO

## 2021-05-08 ENCOUNTER — Ambulatory Visit: Payer: Medicare HMO | Admitting: Cardiology

## 2021-05-08 ENCOUNTER — Other Ambulatory Visit: Payer: Self-pay

## 2021-05-08 ENCOUNTER — Encounter: Payer: Self-pay | Admitting: Cardiology

## 2021-05-08 VITALS — BP 144/60 | HR 77 | Ht 70.0 in | Wt 150.0 lb

## 2021-05-08 DIAGNOSIS — E1169 Type 2 diabetes mellitus with other specified complication: Secondary | ICD-10-CM | POA: Diagnosis not present

## 2021-05-08 DIAGNOSIS — F17200 Nicotine dependence, unspecified, uncomplicated: Secondary | ICD-10-CM

## 2021-05-08 DIAGNOSIS — I10 Essential (primary) hypertension: Secondary | ICD-10-CM | POA: Diagnosis not present

## 2021-05-08 DIAGNOSIS — I6521 Occlusion and stenosis of right carotid artery: Secondary | ICD-10-CM | POA: Diagnosis not present

## 2021-05-08 DIAGNOSIS — E785 Hyperlipidemia, unspecified: Secondary | ICD-10-CM

## 2021-05-08 DIAGNOSIS — R69 Illness, unspecified: Secondary | ICD-10-CM | POA: Diagnosis not present

## 2021-05-08 DIAGNOSIS — I442 Atrioventricular block, complete: Secondary | ICD-10-CM

## 2021-05-08 DIAGNOSIS — I441 Atrioventricular block, second degree: Secondary | ICD-10-CM

## 2021-05-08 DIAGNOSIS — E782 Mixed hyperlipidemia: Secondary | ICD-10-CM | POA: Diagnosis not present

## 2021-05-08 LAB — CUP PACEART REMOTE DEVICE CHECK
Battery Remaining Longevity: 119 mo
Battery Remaining Percentage: 95.5 %
Battery Voltage: 3.01 V
Brady Statistic AP VP Percent: 9.7 %
Brady Statistic AP VS Percent: 2.9 %
Brady Statistic AS VP Percent: 70 %
Brady Statistic AS VS Percent: 18 %
Brady Statistic RA Percent Paced: 12 %
Brady Statistic RV Percent Paced: 79 %
Date Time Interrogation Session: 20221103020012
Implantable Lead Implant Date: 20220504
Implantable Lead Implant Date: 20220504
Implantable Lead Location: 753859
Implantable Lead Location: 753860
Implantable Pulse Generator Implant Date: 20220504
Lead Channel Impedance Value: 410 Ohm
Lead Channel Impedance Value: 510 Ohm
Lead Channel Pacing Threshold Amplitude: 0.5 V
Lead Channel Pacing Threshold Amplitude: 0.5 V
Lead Channel Pacing Threshold Pulse Width: 0.5 ms
Lead Channel Pacing Threshold Pulse Width: 0.5 ms
Lead Channel Sensing Intrinsic Amplitude: 4.5 mV
Lead Channel Sensing Intrinsic Amplitude: 7.9 mV
Lead Channel Setting Pacing Amplitude: 0.75 V
Lead Channel Setting Pacing Amplitude: 1.5 V
Lead Channel Setting Pacing Pulse Width: 0.5 ms
Lead Channel Setting Sensing Sensitivity: 2 mV
Pulse Gen Model: 2272
Pulse Gen Serial Number: 3920934

## 2021-05-08 NOTE — Addendum Note (Signed)
Addended by: Resa Miner I on: 05/08/2021 10:05 AM   Modules accepted: Orders

## 2021-05-08 NOTE — Patient Instructions (Signed)
Medication Instructions:  Your physician recommends that you continue on your current medications as directed. Please refer to the Current Medication list given to you today.  *If you need a refill on your cardiac medications before your next appointment, please call your pharmacy*   Lab Work: None If you have labs (blood work) drawn today and your tests are completely normal, you will receive your results only by: [place] (if you have MyChart) OR A paper copy in the mail If you have any lab test that is abnormal or we need to change your treatment, we will call you to review the results.   Testing/Procedures: Your physician has requested that you have a carotid duplex. This test is an ultrasound of the carotid arteries in your neck. It looks at blood flow through these arteries that supply the brain with blood. Allow one hour for this exam. There are no restrictions or special instructions.    Follow-Up: At [facility], you and your health needs are our priority.  As part of our continuing mission to provide you with exceptional heart care, we have created designated Provider Care Teams.  These Care Teams include your primary Cardiologist (physician) and Advanced Practice Providers (APPs -  Physician Assistants and Nurse Practitioners) who all work together to provide you with the care you need, when you need it.  We recommend signing up for the patient portal called "MyChart".  Sign up information is provided on this After Visit Summary.  MyChart is used to connect with patients for Virtual Visits (Telemedicine).  Patients are able to view lab/test results, encounter notes, upcoming appointments, etc.  Non-urgent messages can be sent to your provider as well.   To learn more about what you can do with MyChart, go to [email or web address].    Your next appointment:   6 month(s)  The format for your next appointment:   In Person  Provider:   Jenne Campus,  MD   Other Instructions

## 2021-05-08 NOTE — Progress Notes (Signed)
Cardiology Office Note:    Date:  05/08/2021   ID:  John Wiggins, DOB [birth year], MRN [id number]  PCP:  Nicoletta Dress, MD  Cardiologist:  Jenne Campus, MD    Referring MD: Nicoletta Dress, MD   Chief Complaint  Patient presents with   Follow-up  Doing very well  History of Present Illness:    John Wiggins is a 80 y.o. male who was referred to [place] couple months ago because of shortness of breath he was found to be in second-degree AV block and decision has been made to pursue pacemaker implantation which was done in May he did receive dual-chamber device.  Since that time he is doing dramatically better.  Additional problems include COPD, essential hypertension, carotic arterial disease, smoking with sadly still ongoing.  He denies having any chest pain tightness squeezing pressure burning chest no dizziness.  Again he mentioned that he is feeling much better in terms of his ability to do things.  Past Medical History:  Diagnosis Date   Allergy    Asbestos-induced pleural plaque 09/14/2017   Asthma    Carotid artery disease ([place])    a. Carotid US 8/17: RICA 50-69% // b. 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38; worked in asbestos; followed by PCP   RAD (reactive airway disease)    Snoring 03/18/2016   Tobacco dependence 03/18/2016   Type 2 diabetes mellitus with hyperlipidemia [facility])     Past Surgical History:  Procedure Laterality Date   CATARACT EXTRACTION Bilateral    PACEMAKER IMPLANT N/A 11/06/2020   Procedure: PACEMAKER IMPLANT;  Surgeon: Constance Haw, MD;  Location: [place] [facility];  Service: Cardiovascular;  Laterality: N/A;   SKIN GRAFT Right    Right 4th finger    TONSILLECTOMY      Current Medications: Current Meds  Medication Sig   acetaminophen (TYLENOL) 325 MG tablet Take 1-2 tablets (325-650 mg total) by mouth every 4 (four) hours as needed for mild pain.   aspirin EC 81 MG tablet Take 81 mg by mouth daily.   atorvastatin (LIPITOR) 80 MG tablet Take 80 mg by mouth daily.    cetirizine (ZYRTEC) 10 MG tablet Take 10 mg by mouth daily.   Fluticasone-Umeclidin-Vilant (TRELEGY ELLIPTA) 100-62.5-25 MCG/INH AEPB Inhale 1 puff into the lungs daily.   PROAIR HFA 108 (90 Base) MCG/ACT inhaler Inhale 2 puffs into the lungs 4 (four) times daily as needed for wheezing or shortness of breath.   valsartan (DIOVAN) 80 MG tablet Take 80 mg by mouth daily.     Allergies:   Patient has no known allergies.   Social History   Socioeconomic History   Marital status: Married    Spouse name: Not on file   Number of children: Not on file   Years of education: Not on file   Highest education level: Not on file  Occupational History   Not on file  Tobacco Use   Smoking status: Every Day    Packs/day: 0.50    Years: 60.00    Pack years: 30.00    Types: Cigarettes    Last attempt to quit: 02/28/2016    Years since  quitting: 5.1   Smokeless tobacco: Never   Tobacco comments:    2 or 3 cigs a day, on chantix  Vaping Use   Vaping Use: Never used  Substance and Sexual Activity   Alcohol use: No    Comment: social   Drug use: No   Sexual activity: Not on file  Other Topics Concern   Not on file  Social History Narrative   Retired   Worked maintenance for [employer]   Prior worked with [employer]   Wife - Inez Catalina (married in 2014)   2 sons   2 grandsons   Lives in [place], [place]   Social Determinants of Health   Financial Resource Strain: Not on file  Food Insecurity: Not on file  Transportation Needs: Not on file  Physical Activity: Not on file  Stress: Not on file  Social Connections: Not on file     Family History: The  patient's family history includes Atrial fibrillation in his brother; Bladder Cancer in his brother; Heart disease in his father; Hypertension in his mother; Lung cancer in his brother; Skin cancer in his father; Stroke in his mother. ROS:   Please see the history of present illness.    All 14 point review of systems negative except as described per history of present illness  EKGs/Labs/Other Studies Reviewed:      Recent Labs: 11/05/2020: ALT 34 11/06/2020: BUN 27; Creatinine, Ser 1.09; Hemoglobin 12.1; Magnesium 1.9; Platelets 231; Potassium 4.5; Sodium 137; TSH 1.246  Recent Lipid Panel No results found for: CHOL, TRIG, HDL, CHOLHDL, VLDL, LDLCALC, LDLDIRECT  Physical Exam:    VS:  BP (!) 144/60 (BP Location: Right Arm, Patient Position: Sitting)   Pulse 77   Ht 5\' 10"  (1.778 m)   Wt 150 lb (68 kg)   SpO2 93%   BMI 21.52 kg/m     Wt Readings from Last 3 Encounters:  05/08/21 150 lb (68 kg)  03/25/21 146 lb 3.2 oz (66.3 kg)  12/18/20 155 lb (70.3 kg)     GEN:  Well nourished, well developed in no acute distress HEENT: Normal NECK: No JVD; No carotid bruits LYMPHATICS: No lymphadenopathy CARDIAC: RRR, no murmurs, no rubs, no gallops RESPIRATORY:  Clear to auscultation without rales, wheezing or rhonchi  ABDOMEN: Soft, non-tender, non-distended MUSCULOSKELETAL:  No edema; No deformity  SKIN: Warm and dry LOWER EXTREMITIES: no swelling NEUROLOGIC:  Alert and oriented x 3 PSYCHIATRIC:  Normal affect   ASSESSMENT:    1. Second degree AV block   2. Primary hypertension   3. Stenosis of right carotid artery   4. Type 2 diabetes mellitus with hyperlipidemia ([place])   5. Mixed hyperlipidemia   6. Tobacco dependence    PLAN:    In order of problems listed above:  Second-degree AV block that being addressed with dual-chamber pacemaker pacemaker functioning properly we will continue present management. Essential hypertension last time when he seen EP team he was noted to  have low blood pressure his Diovan has been temporarily held look like he is back on the medication blood pressure seems to be well controlled we will continue present management. Type 2 diabetes that being followed by antimedicine team I did review K PN which show me his hemoglobin was 6.5 which is a good number. Dyslipidemia he is on Lipitor 80 which is an excellent choice high intensity statin LDL 58 HDL 41 and this is from March 31, 2021 this is from K PN. Cortical chill disease  I will schedule him to have carotic ultrasound. Mitral regurgitation which being assessed as mild to moderate based on echocardiogram.  Overall he have very few symptoms of it we will repeat echocardiogram in a few months to recheck it.  In the meantime we will continue ARB   Medication Adjustments/Labs and Tests Ordered: Current medicines are reviewed at length with the patient today.  Concerns regarding medicines are outlined above.  No orders of the defined types were placed in this encounter.  Medication changes: No orders of the defined types were placed in this encounter.   Signed, Park Liter, MD, [facility] 05/08/2021 9:54 AM    [place]

## 2021-05-09 ENCOUNTER — Ambulatory Visit (INDEPENDENT_AMBULATORY_CARE_PROVIDER_SITE_OTHER): Payer: Medicare HMO

## 2021-05-09 DIAGNOSIS — I6521 Occlusion and stenosis of right carotid artery: Secondary | ICD-10-CM | POA: Diagnosis not present

## 2021-05-14 NOTE — Progress Notes (Signed)
Remote pacemaker transmission.   

## 2021-05-19 ENCOUNTER — Encounter: Payer: Medicare HMO | Admitting: Cardiology

## 2021-05-19 DIAGNOSIS — C801 Malignant (primary) neoplasm, unspecified: Secondary | ICD-10-CM | POA: Diagnosis not present

## 2021-05-19 DIAGNOSIS — K5900 Constipation, unspecified: Secondary | ICD-10-CM | POA: Diagnosis not present

## 2021-05-19 DIAGNOSIS — K5790 Diverticulosis of intestine, part unspecified, without perforation or abscess without bleeding: Secondary | ICD-10-CM | POA: Diagnosis not present

## 2021-05-19 DIAGNOSIS — K259 Gastric ulcer, unspecified as acute or chronic, without hemorrhage or perforation: Secondary | ICD-10-CM | POA: Diagnosis not present

## 2021-05-22 DIAGNOSIS — H353211 Exudative age-related macular degeneration, right eye, with active choroidal neovascularization: Secondary | ICD-10-CM | POA: Diagnosis not present

## 2021-05-26 DIAGNOSIS — E785 Hyperlipidemia, unspecified: Secondary | ICD-10-CM | POA: Diagnosis not present

## 2021-05-26 DIAGNOSIS — Z9181 History of falling: Secondary | ICD-10-CM | POA: Diagnosis not present

## 2021-05-26 DIAGNOSIS — Z1331 Encounter for screening for depression: Secondary | ICD-10-CM | POA: Diagnosis not present

## 2021-05-26 DIAGNOSIS — Z0000 Encounter for general adult medical examination without abnormal findings: Secondary | ICD-10-CM | POA: Diagnosis not present

## 2021-06-10 DIAGNOSIS — K648 Other hemorrhoids: Secondary | ICD-10-CM | POA: Diagnosis not present

## 2021-06-10 DIAGNOSIS — D124 Benign neoplasm of descending colon: Secondary | ICD-10-CM | POA: Diagnosis not present

## 2021-06-10 DIAGNOSIS — K5730 Diverticulosis of large intestine without perforation or abscess without bleeding: Secondary | ICD-10-CM | POA: Diagnosis not present

## 2021-06-10 DIAGNOSIS — B9681 Helicobacter pylori [H. pylori] as the cause of diseases classified elsewhere: Secondary | ICD-10-CM | POA: Diagnosis not present

## 2021-06-10 DIAGNOSIS — K2970 Gastritis, unspecified, without bleeding: Secondary | ICD-10-CM | POA: Diagnosis not present

## 2021-06-10 DIAGNOSIS — D127 Benign neoplasm of rectosigmoid junction: Secondary | ICD-10-CM | POA: Diagnosis not present

## 2021-06-10 DIAGNOSIS — R69 Illness, unspecified: Secondary | ICD-10-CM | POA: Diagnosis not present

## 2021-06-10 DIAGNOSIS — K259 Gastric ulcer, unspecified as acute or chronic, without hemorrhage or perforation: Secondary | ICD-10-CM | POA: Diagnosis not present

## 2021-06-10 DIAGNOSIS — K222 Esophageal obstruction: Secondary | ICD-10-CM | POA: Diagnosis not present

## 2021-06-10 DIAGNOSIS — K635 Polyp of colon: Secondary | ICD-10-CM | POA: Diagnosis not present

## 2021-06-10 DIAGNOSIS — D126 Benign neoplasm of colon, unspecified: Secondary | ICD-10-CM | POA: Diagnosis not present

## 2021-06-10 DIAGNOSIS — K621 Rectal polyp: Secondary | ICD-10-CM | POA: Diagnosis not present

## 2021-06-10 DIAGNOSIS — K2980 Duodenitis without bleeding: Secondary | ICD-10-CM | POA: Diagnosis not present

## 2021-06-10 DIAGNOSIS — D122 Benign neoplasm of ascending colon: Secondary | ICD-10-CM | POA: Diagnosis not present

## 2021-06-10 DIAGNOSIS — C187 Malignant neoplasm of sigmoid colon: Secondary | ICD-10-CM | POA: Diagnosis not present

## 2021-06-10 DIAGNOSIS — D128 Benign neoplasm of rectum: Secondary | ICD-10-CM | POA: Diagnosis not present

## 2021-06-10 DIAGNOSIS — E119 Type 2 diabetes mellitus without complications: Secondary | ICD-10-CM | POA: Diagnosis not present

## 2021-07-03 DIAGNOSIS — H353121 Nonexudative age-related macular degeneration, left eye, early dry stage: Secondary | ICD-10-CM | POA: Diagnosis not present

## 2021-07-03 DIAGNOSIS — H353211 Exudative age-related macular degeneration, right eye, with active choroidal neovascularization: Secondary | ICD-10-CM | POA: Diagnosis not present

## 2021-08-07 ENCOUNTER — Ambulatory Visit (INDEPENDENT_AMBULATORY_CARE_PROVIDER_SITE_OTHER): Payer: Medicare HMO

## 2021-08-07 DIAGNOSIS — I441 Atrioventricular block, second degree: Secondary | ICD-10-CM | POA: Diagnosis not present

## 2021-08-07 LAB — CUP PACEART REMOTE DEVICE CHECK
Battery Remaining Longevity: 116 mo
Battery Remaining Percentage: 95 %
Battery Voltage: 3.01 V
Brady Statistic AP VP Percent: 11 %
Brady Statistic AP VS Percent: 1.1 %
Brady Statistic AS VP Percent: 81 %
Brady Statistic AS VS Percent: 6.4 %
Brady Statistic RA Percent Paced: 12 %
Brady Statistic RV Percent Paced: 92 %
Date Time Interrogation Session: 20230202020015
Implantable Lead Implant Date: 20220504
Implantable Lead Implant Date: 20220504
Implantable Lead Location: 753859
Implantable Lead Location: 753860
Implantable Pulse Generator Implant Date: 20220504
Lead Channel Impedance Value: 410 Ohm
Lead Channel Impedance Value: 510 Ohm
Lead Channel Pacing Threshold Amplitude: 0.375 V
Lead Channel Pacing Threshold Amplitude: 0.5 V
Lead Channel Pacing Threshold Pulse Width: 0.5 ms
Lead Channel Pacing Threshold Pulse Width: 0.5 ms
Lead Channel Sensing Intrinsic Amplitude: 4.1 mV
Lead Channel Sensing Intrinsic Amplitude: 8.5 mV
Lead Channel Setting Pacing Amplitude: 0.625
Lead Channel Setting Pacing Amplitude: 1.5 V
Lead Channel Setting Pacing Pulse Width: 0.5 ms
Lead Channel Setting Sensing Sensitivity: 2 mV
Pulse Gen Model: 2272
Pulse Gen Serial Number: 3920934

## 2021-08-13 NOTE — Progress Notes (Signed)
Remote pacemaker transmission.   

## 2021-08-28 DIAGNOSIS — H353211 Exudative age-related macular degeneration, right eye, with active choroidal neovascularization: Secondary | ICD-10-CM | POA: Diagnosis not present

## 2021-09-29 DIAGNOSIS — Z87891 Personal history of nicotine dependence: Secondary | ICD-10-CM | POA: Diagnosis not present

## 2021-09-29 DIAGNOSIS — J449 Chronic obstructive pulmonary disease, unspecified: Secondary | ICD-10-CM | POA: Diagnosis not present

## 2021-09-29 DIAGNOSIS — E785 Hyperlipidemia, unspecified: Secondary | ICD-10-CM | POA: Diagnosis not present

## 2021-09-29 DIAGNOSIS — E1169 Type 2 diabetes mellitus with other specified complication: Secondary | ICD-10-CM | POA: Diagnosis not present

## 2021-09-29 DIAGNOSIS — G8192 Hemiplegia, unspecified affecting left dominant side: Secondary | ICD-10-CM | POA: Diagnosis not present

## 2021-09-29 DIAGNOSIS — Z6823 Body mass index (BMI) 23.0-23.9, adult: Secondary | ICD-10-CM | POA: Diagnosis not present

## 2021-09-29 DIAGNOSIS — I10 Essential (primary) hypertension: Secondary | ICD-10-CM | POA: Diagnosis not present

## 2021-10-09 DIAGNOSIS — H353211 Exudative age-related macular degeneration, right eye, with active choroidal neovascularization: Secondary | ICD-10-CM | POA: Diagnosis not present

## 2021-11-05 ENCOUNTER — Ambulatory Visit: Payer: Medicare HMO | Admitting: Cardiology

## 2021-11-05 ENCOUNTER — Encounter: Payer: Self-pay | Admitting: Cardiology

## 2021-11-05 VITALS — BP 170/82 | HR 75 | Ht 70.0 in | Wt 155.2 lb

## 2021-11-05 DIAGNOSIS — E782 Mixed hyperlipidemia: Secondary | ICD-10-CM | POA: Diagnosis not present

## 2021-11-05 DIAGNOSIS — J449 Chronic obstructive pulmonary disease, unspecified: Secondary | ICD-10-CM

## 2021-11-05 DIAGNOSIS — I6521 Occlusion and stenosis of right carotid artery: Secondary | ICD-10-CM

## 2021-11-05 DIAGNOSIS — R0609 Other forms of dyspnea: Secondary | ICD-10-CM | POA: Diagnosis not present

## 2021-11-05 DIAGNOSIS — I441 Atrioventricular block, second degree: Secondary | ICD-10-CM | POA: Diagnosis not present

## 2021-11-05 NOTE — Patient Instructions (Signed)

## 2021-11-05 NOTE — Progress Notes (Signed)
?Cardiology Office Note:   ? ?Date:  11/05/2021  ? ?ID:  John Wiggins, DOB February 13, 1941, MRN [id number] ? ?PCP:  Nicoletta Dress, MD  ?Cardiologist:  Jenne Campus, MD   ? ?Referring MD: Nicoletta Dress, MD  ? ?Chief Complaint  ?Patient presents with  ? Follow-up  ?Doing very well ? ?History of Present Illness:   ? ?John Wiggins is a 81 y.o. male with past medical history significant for second-degree AV block that required pacemaker.  He does have device right now doing well.  Also COPD, smoking which is still ongoing, essential hypertension, carotic arterial disease, mitral regurgitation.  Comes to my office today for follow-up.  Overall doing very well.  Denies have any chest pain tightness squeezing pressure burning chest no palpitations dizziness swelling of lower extremities.  The only thing he would complain about is the fact that the seatbelt from a car rubs his pacemaker which bothers him some likely pacemaker look pristine no evidence of infection no evidence of irritation. ? ?Past Medical History:  ?Diagnosis Date  ? Allergy   ? Asbestos-induced pleural plaque 09/14/2017  ? Asthma   ? Carotid artery disease ([place])   ? a. Carotid US 8/17: RICA 50-69% // b. Neck CTA 8/17: pRICA 35%  ? COPD with chronic bronchitis and emphysema ([place]) 09/14/2017  ? History of TIA (transient ischemic attack)   ? 02/2016; L arm weakness  ? Hyperlipidemia, unspecified 03/18/2016  ? Hypertension   ? Laceration of blood vessel of right ring finger 10/01/2016  ? Mesothelioma of both lungs [facility])   ? 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 RAD (reactive airway disease)   ? Snoring 03/18/2016  ? Tobacco dependence 03/18/2016  ? Type 2 diabetes mellitus with hyperlipidemia ([place])   ? ? ?Past Surgical History:  ?Procedure Laterality Date  ? CATARACT EXTRACTION Bilateral   ? PACEMAKER IMPLANT N/A 11/06/2020  ? Procedure: PACEMAKER IMPLANT;  Surgeon: Constance Haw, MD;  Location: [place] [facility];  Service:  Cardiovascular;  Laterality: N/A;  ? SKIN GRAFT Right   ? Right 4th finger  ? TONSILLECTOMY    ? ? ?Current Medications: ?Current Meds  ?Medication Sig  ? acetaminophen (TYLENOL) 325 MG tablet Take 1-2 tablets (325-650 mg total) by mouth every 4 (four) hours as needed for mild pain.  ? aspirin EC 81 MG tablet Take 81 mg by mouth daily.  ? atorvastatin (LIPITOR) 80 MG tablet Take 80 mg by mouth daily.   ? cetirizine (ZYRTEC) 10 MG tablet Take 10 mg by mouth daily.  ? Fluticasone-Umeclidin-Vilant (TRELEGY ELLIPTA) 100-62.5-25 MCG/INH AEPB Inhale 1 puff into the lungs daily.  ? PROAIR HFA 108 (90 Base) MCG/ACT inhaler Inhale 2 puffs into the lungs 4 (four) times daily as needed for wheezing or shortness of breath.  ? valsartan (DIOVAN) 80 MG tablet Take 80 mg by mouth daily.  ?  ? ?Allergies:   Patient has no known allergies.  ? ?Social History  ? ?Socioeconomic History  ? Marital status: Married  ?  Spouse name: Not on file  ? Number of children: Not on file  ? Years of education: Not on file  ? Highest education level: Not on file  ?Occupational History  ? Not on file  ?Tobacco Use  ? Smoking status: Every Day  ?  Packs/day: 0.50  ?  Years: 60.00  ?  Pack years: 30.00  ?  Types: Cigarettes  ?  Last attempt to quit:  02/28/2016  ?  Years since quitting: 5.6  ? Smokeless tobacco: Never  ? Tobacco comments:  ?  2 or 3 cigs a day, on chantix  ?Vaping Use  ? Vaping Use: Never used  ?Substance and Sexual Activity  ? Alcohol use: No  ?  Comment: social  ? Drug use: No  ? Sexual activity: Not on file  ?Other Topics Concern  ? Not on file  ?Social History Narrative  ? Retired  ? Worked maintenance for [employer]  ? Prior worked with [employer]  ? Wife - John Wiggins (married in 2014)  ? 2 sons  ? 2 grandsons  ? Lives in [place], [place]  ? ?Social Determinants of Health  ? ?Financial Resource Strain: Not on file  ?Food Insecurity: Not on file  ?Transportation Needs: Not on file  ?Physical Activity: Not on file  ?Stress: Not  on file  ?Social Connections: Not on file  ?  ? ?Family History: ?The patient's family history includes Atrial fibrillation in his brother; Bladder Cancer in his brother; Heart disease in his father; Hypertension in his mother; Lung cancer in his brother; Skin cancer in his father; Stroke in his mother. ?ROS:   ?Please see the history of present illness.    ?All 14 point review of systems negative except as described per history of present illness ? ?EKGs/Labs/Other Studies Reviewed:   ? ? ? ?Recent Labs: ?11/05/2020: ALT 34 ?11/06/2020: BUN 27; Creatinine, Ser 1.09; Hemoglobin 12.1; Magnesium 1.9; Platelets 231; Potassium 4.5; Sodium 137; TSH 1.246  ?Recent Lipid Panel ?No results found for: CHOL, TRIG, HDL, CHOLHDL, VLDL, LDLCALC, LDLDIRECT ? ?Physical Exam:   ? ?VS:  BP (!) 170/82 (BP Location: Left Arm, Patient Position: Sitting)   Pulse 75   Ht '5\' 10"'$  (1.778 m)   Wt 155 lb 3.2 oz (70.4 kg)   SpO2 100%   BMI 22.27 kg/m?    ? ?Wt Readings from Last 3 Encounters:  ?11/05/21 155 lb 3.2 oz (70.4 kg)  ?05/08/21 150 lb (68 kg)  ?03/25/21 146 lb 3.2 oz (66.3 kg)  ?  ? ?GEN:  Well nourished, well developed in no acute distress ?HEENT: Normal ?NECK: No JVD; No carotid bruits ?LYMPHATICS: No lymphadenopathy ?CARDIAC: RRR, soft systolic murmur grade 1/6, overall tones are distant no rubs, no gallops ?RESPIRATORY:  Clear to auscultation without rales, wheezing or rhonchi  ?ABDOMEN: Soft, non-tender, non-distended ?MUSCULOSKELETAL:  No edema; No deformity  ?SKIN: Warm and dry ?LOWER EXTREMITIES: no swelling ?NEUROLOGIC:  Alert and oriented x 3 ?PSYCHIATRIC:  Normal affect  ? ?ASSESSMENT:   ? ?1. Second degree AV block   ?2. Stenosis of right carotid artery   ?3. COPD with chronic bronchitis and emphysema ([place])   ?4. Dyspnea on exertion   ?5. Mixed hyperlipidemia   ? ?PLAN:   ? ?In order of problems listed above: ? ?Second-degree AV block.  That being addressed with the pacemaker, I did review interrogation normal  function ?Cardiac arterial disease not critical based on the last ultrasounds we will continue present management. ?COPD with smoking still ongoing of course I spent great of time talking about the need to quit hopefully he will be able to accomplish that. ?Mixed dyslipidemia I did review K PN which show LDL of 161 HDL 38, this is from September 29, 2021 at that time he was started with Lipitor 80.  He is scheduled to see primary care physician in 2 weeks to have his cholesterol rechecked, will wait for  it. ?We did talk about healthy lifestyle need to exercise on the regular basis, he does have mitral regurgitation I will repeat echocardiogram to recheck on degree of it ? ? ?Medication Adjustments/Labs and Tests Ordered: ?Current medicines are reviewed at length with the patient today.  Concerns regarding medicines are outlined above.  ?No orders of the defined types were placed in this encounter. ? ?Medication changes: No orders of the defined types were placed in this encounter. ? ? ?Signed, ?Park Liter, MD, [facility] ?11/05/2021 8:42 AM    ?[place] ?

## 2021-11-05 NOTE — Addendum Note (Signed)
Addended by: Jacobo Forest D on: 11/05/2021 08:48 AM ? ? Modules accepted: Orders ? ?

## 2021-11-06 ENCOUNTER — Ambulatory Visit (INDEPENDENT_AMBULATORY_CARE_PROVIDER_SITE_OTHER): Payer: Medicare HMO

## 2021-11-06 DIAGNOSIS — I441 Atrioventricular block, second degree: Secondary | ICD-10-CM

## 2021-11-06 LAB — CUP PACEART REMOTE DEVICE CHECK
Battery Remaining Longevity: 113 mo
Battery Remaining Percentage: 93 %
Battery Voltage: 3.01 V
Brady Statistic AP VP Percent: 13 %
Brady Statistic AP VS Percent: 1 %
Brady Statistic AS VP Percent: 83 %
Brady Statistic AS VS Percent: 4 %
Brady Statistic RA Percent Paced: 13 %
Brady Statistic RV Percent Paced: 95 %
Date Time Interrogation Session: 20230504030906
Implantable Lead Implant Date: 20220504
Implantable Lead Implant Date: 20220504
Implantable Lead Location: 753859
Implantable Lead Location: 753860
Implantable Pulse Generator Implant Date: 20220504
Lead Channel Impedance Value: 410 Ohm
Lead Channel Impedance Value: 510 Ohm
Lead Channel Pacing Threshold Amplitude: 0.5 V
Lead Channel Pacing Threshold Amplitude: 0.625 V
Lead Channel Pacing Threshold Pulse Width: 0.5 ms
Lead Channel Pacing Threshold Pulse Width: 0.5 ms
Lead Channel Sensing Intrinsic Amplitude: 5 mV
Lead Channel Sensing Intrinsic Amplitude: 8.8 mV
Lead Channel Setting Pacing Amplitude: 0.75 V
Lead Channel Setting Pacing Amplitude: 1.625
Lead Channel Setting Pacing Pulse Width: 0.5 ms
Lead Channel Setting Sensing Sensitivity: 2 mV
Pulse Gen Model: 2272
Pulse Gen Serial Number: 3920934

## 2021-11-12 ENCOUNTER — Ambulatory Visit (INDEPENDENT_AMBULATORY_CARE_PROVIDER_SITE_OTHER): Payer: Medicare HMO

## 2021-11-12 DIAGNOSIS — R0609 Other forms of dyspnea: Secondary | ICD-10-CM | POA: Diagnosis not present

## 2021-11-12 LAB — ECHOCARDIOGRAM COMPLETE
Area-P 1/2: 2.45 cm2
MV M vel: 5.57 m/s
MV Peak grad: 124.1 mmHg
Radius: 0.5 cm
S' Lateral: 3 cm

## 2021-11-19 NOTE — Progress Notes (Signed)
Remote pacemaker transmission.   

## 2021-11-20 ENCOUNTER — Telehealth: Payer: Self-pay

## 2021-11-20 DIAGNOSIS — H353211 Exudative age-related macular degeneration, right eye, with active choroidal neovascularization: Secondary | ICD-10-CM | POA: Diagnosis not present

## 2021-11-20 NOTE — Telephone Encounter (Signed)
-----   Message from Park Liter, MD sent at 11/12/2021  1:55 PM EDT ----- Echocardiogram showed normal left ventricle ejection fraction, mild mitral valve regurgitation, overall looks good

## 2021-11-20 NOTE — Telephone Encounter (Signed)
Spoke with Ruby , notified of results.

## 2022-01-15 DIAGNOSIS — H353211 Exudative age-related macular degeneration, right eye, with active choroidal neovascularization: Secondary | ICD-10-CM | POA: Diagnosis not present

## 2022-02-05 ENCOUNTER — Ambulatory Visit (INDEPENDENT_AMBULATORY_CARE_PROVIDER_SITE_OTHER): Payer: Medicare HMO

## 2022-02-05 DIAGNOSIS — I441 Atrioventricular block, second degree: Secondary | ICD-10-CM

## 2022-02-05 LAB — CUP PACEART REMOTE DEVICE CHECK
Battery Remaining Longevity: 108 mo
Battery Remaining Percentage: 90 %
Battery Voltage: 3.01 V
Brady Statistic AP VP Percent: 15 %
Brady Statistic AP VS Percent: 1 %
Brady Statistic AS VP Percent: 82 %
Brady Statistic AS VS Percent: 2.9 %
Brady Statistic RA Percent Paced: 15 %
Brady Statistic RV Percent Paced: 97 %
Date Time Interrogation Session: 20230803035019
Implantable Lead Implant Date: 20220504
Implantable Lead Implant Date: 20220504
Implantable Lead Location: 753859
Implantable Lead Location: 753860
Implantable Pulse Generator Implant Date: 20220504
Lead Channel Impedance Value: 410 Ohm
Lead Channel Impedance Value: 480 Ohm
Lead Channel Pacing Threshold Amplitude: 0.625 V
Lead Channel Pacing Threshold Amplitude: 0.625 V
Lead Channel Pacing Threshold Pulse Width: 0.5 ms
Lead Channel Pacing Threshold Pulse Width: 0.5 ms
Lead Channel Sensing Intrinsic Amplitude: 12 mV
Lead Channel Sensing Intrinsic Amplitude: 5 mV
Lead Channel Setting Pacing Amplitude: 0.875
Lead Channel Setting Pacing Amplitude: 1.625
Lead Channel Setting Pacing Pulse Width: 0.5 ms
Lead Channel Setting Sensing Sensitivity: 2 mV
Pulse Gen Model: 2272
Pulse Gen Serial Number: 3920934

## 2022-02-26 NOTE — Progress Notes (Signed)
Remote pacemaker transmission.   

## 2022-03-26 DIAGNOSIS — H353121 Nonexudative age-related macular degeneration, left eye, early dry stage: Secondary | ICD-10-CM | POA: Diagnosis not present

## 2022-03-26 DIAGNOSIS — E119 Type 2 diabetes mellitus without complications: Secondary | ICD-10-CM | POA: Diagnosis not present

## 2022-03-26 DIAGNOSIS — H353211 Exudative age-related macular degeneration, right eye, with active choroidal neovascularization: Secondary | ICD-10-CM | POA: Diagnosis not present

## 2022-04-01 DIAGNOSIS — L578 Other skin changes due to chronic exposure to nonionizing radiation: Secondary | ICD-10-CM | POA: Diagnosis not present

## 2022-04-01 DIAGNOSIS — L821 Other seborrheic keratosis: Secondary | ICD-10-CM | POA: Diagnosis not present

## 2022-04-01 DIAGNOSIS — L209 Atopic dermatitis, unspecified: Secondary | ICD-10-CM | POA: Diagnosis not present

## 2022-04-01 DIAGNOSIS — L570 Actinic keratosis: Secondary | ICD-10-CM | POA: Diagnosis not present

## 2022-04-01 DIAGNOSIS — L853 Xerosis cutis: Secondary | ICD-10-CM | POA: Diagnosis not present

## 2022-04-02 DIAGNOSIS — Z125 Encounter for screening for malignant neoplasm of prostate: Secondary | ICD-10-CM | POA: Diagnosis not present

## 2022-04-02 DIAGNOSIS — Z23 Encounter for immunization: Secondary | ICD-10-CM | POA: Diagnosis not present

## 2022-04-02 DIAGNOSIS — G8192 Hemiplegia, unspecified affecting left dominant side: Secondary | ICD-10-CM | POA: Diagnosis not present

## 2022-04-02 DIAGNOSIS — E785 Hyperlipidemia, unspecified: Secondary | ICD-10-CM | POA: Diagnosis not present

## 2022-04-02 DIAGNOSIS — Z6822 Body mass index (BMI) 22.0-22.9, adult: Secondary | ICD-10-CM | POA: Diagnosis not present

## 2022-04-02 DIAGNOSIS — E1169 Type 2 diabetes mellitus with other specified complication: Secondary | ICD-10-CM | POA: Diagnosis not present

## 2022-04-02 DIAGNOSIS — I10 Essential (primary) hypertension: Secondary | ICD-10-CM | POA: Diagnosis not present

## 2022-04-30 DIAGNOSIS — J440 Chronic obstructive pulmonary disease with acute lower respiratory infection: Secondary | ICD-10-CM | POA: Diagnosis not present

## 2022-04-30 DIAGNOSIS — J209 Acute bronchitis, unspecified: Secondary | ICD-10-CM | POA: Diagnosis not present

## 2022-04-30 DIAGNOSIS — J019 Acute sinusitis, unspecified: Secondary | ICD-10-CM | POA: Diagnosis not present

## 2022-05-07 ENCOUNTER — Ambulatory Visit (INDEPENDENT_AMBULATORY_CARE_PROVIDER_SITE_OTHER): Payer: Medicare HMO

## 2022-05-07 DIAGNOSIS — I441 Atrioventricular block, second degree: Secondary | ICD-10-CM | POA: Diagnosis not present

## 2022-05-07 LAB — CUP PACEART REMOTE DEVICE CHECK
Battery Remaining Longevity: 104 mo
Battery Remaining Percentage: 87 %
Battery Voltage: 3.01 V
Brady Statistic AP VP Percent: 16 %
Brady Statistic AP VS Percent: 1 %
Brady Statistic AS VP Percent: 81 %
Brady Statistic AS VS Percent: 2.3 %
Brady Statistic RA Percent Paced: 16 %
Brady Statistic RV Percent Paced: 97 %
Date Time Interrogation Session: 20231102022713
Implantable Lead Connection Status: 753985
Implantable Lead Connection Status: 753985
Implantable Lead Implant Date: 20220504
Implantable Lead Implant Date: 20220504
Implantable Lead Location: 753859
Implantable Lead Location: 753860
Implantable Pulse Generator Implant Date: 20220504
Lead Channel Impedance Value: 450 Ohm
Lead Channel Impedance Value: 490 Ohm
Lead Channel Pacing Threshold Amplitude: 0.5 V
Lead Channel Pacing Threshold Amplitude: 0.625 V
Lead Channel Pacing Threshold Pulse Width: 0.5 ms
Lead Channel Pacing Threshold Pulse Width: 0.5 ms
Lead Channel Sensing Intrinsic Amplitude: 5 mV
Lead Channel Sensing Intrinsic Amplitude: 9 mV
Lead Channel Setting Pacing Amplitude: 0.875
Lead Channel Setting Pacing Amplitude: 1.5 V
Lead Channel Setting Pacing Pulse Width: 0.5 ms
Lead Channel Setting Sensing Sensitivity: 2 mV
Pulse Gen Model: 2272
Pulse Gen Serial Number: 3920934

## 2022-05-18 NOTE — Progress Notes (Signed)
Remote pacemaker transmission.   

## 2022-06-18 DIAGNOSIS — H353211 Exudative age-related macular degeneration, right eye, with active choroidal neovascularization: Secondary | ICD-10-CM | POA: Diagnosis not present

## 2022-07-15 DIAGNOSIS — J440 Chronic obstructive pulmonary disease with acute lower respiratory infection: Secondary | ICD-10-CM | POA: Diagnosis not present

## 2022-07-15 DIAGNOSIS — J019 Acute sinusitis, unspecified: Secondary | ICD-10-CM | POA: Diagnosis not present

## 2022-07-30 ENCOUNTER — Encounter: Payer: Self-pay | Admitting: Cardiology

## 2022-07-30 ENCOUNTER — Ambulatory Visit: Payer: Medicare HMO | Attending: Cardiology | Admitting: Cardiology

## 2022-07-30 VITALS — BP 132/74 | HR 52 | Ht 70.0 in | Wt 155.0 lb

## 2022-07-30 DIAGNOSIS — I441 Atrioventricular block, second degree: Secondary | ICD-10-CM | POA: Diagnosis not present

## 2022-07-30 DIAGNOSIS — I4510 Unspecified right bundle-branch block: Secondary | ICD-10-CM

## 2022-07-30 DIAGNOSIS — R69 Illness, unspecified: Secondary | ICD-10-CM | POA: Diagnosis not present

## 2022-07-30 DIAGNOSIS — I10 Essential (primary) hypertension: Secondary | ICD-10-CM | POA: Diagnosis not present

## 2022-07-30 DIAGNOSIS — R0609 Other forms of dyspnea: Secondary | ICD-10-CM | POA: Diagnosis not present

## 2022-07-30 DIAGNOSIS — I6521 Occlusion and stenosis of right carotid artery: Secondary | ICD-10-CM

## 2022-07-30 DIAGNOSIS — F17200 Nicotine dependence, unspecified, uncomplicated: Secondary | ICD-10-CM | POA: Diagnosis not present

## 2022-07-30 NOTE — Patient Instructions (Addendum)
Medication Instructions:  Your physician recommends that you continue on your current medications as directed. Please refer to the Current Medication list given to you today.  *If you need a refill on your cardiac medications before your next appointment, please call your pharmacy*   Lab Work: None Ordered If you have labs (blood work) drawn today and your tests are completely normal, you will receive your results only by: [place] (if you have MyChart) OR A paper copy in the mail If you have any lab test that is abnormal or we need to change your treatment, we will call you to review the results.   Testing/Procedures: Your physician has requested that you have a carotid duplex. This test is an ultrasound of the carotid arteries in your neck. It looks at blood flow through these arteries that supply the brain with blood. Allow one hour for this exam. There are no restrictions or special instructions.   Your physician has requested that you have an echocardiogram. Echocardiography is a painless test that uses sound waves to create images of your heart. It provides your doctor with information about the size and shape of your heart and how well your heart's chambers and valves are working. This procedure takes approximately one hour. There are no restrictions for this procedure. Please do NOT wear cologne, perfume, aftershave, or lotions (deodorant is allowed). Please arrive 15 minutes prior to your appointment time.    Follow-Up: At [facility], you and your health needs are our priority.  As part of our continuing mission to provide you with exceptional heart care, we have created designated Provider Care Teams.  These Care Teams include your primary Cardiologist (physician) and Advanced Practice Providers (APPs -  Physician Assistants and Nurse Practitioners) who all work together to provide you with the care you need, when you need it.  We recommend signing up for the patient  portal called "MyChart".  Sign up information is provided on this After Visit Summary.  MyChart is used to connect with patients for Virtual Visits (Telemedicine).  Patients are able to view lab/test results, encounter notes, upcoming appointments, etc.  Non-urgent messages can be sent to your provider as well.   To learn more about what you can do with MyChart, go to [email or web address].    Your next appointment:   12 month(s)  The format for your next appointment:   In Person  Provider:   Jenne Campus, MD    Other Instructions NA

## 2022-07-30 NOTE — Addendum Note (Signed)
Addended by: Jacobo Forest D on: 07/30/2022 11:02 AM   Modules accepted: Orders

## 2022-07-30 NOTE — Progress Notes (Signed)
Cardiology Office Note:    Date:  07/30/2022   ID:  John Wiggins, DOB 19-Jan-1941, MRN [id number]  PCP:  Nicoletta Dress, MD  Cardiologist:  Jenne Campus, MD    Referring MD: Nicoletta Dress, MD   Chief Complaint  Patient presents with   Follow-up  Doing fine  History of Present Illness:    John Wiggins is a 82 y.o. male with past medical history significant for second-degree AV block that required pacemaker, COPD smoking to still ongoing, essential hypertension, carotic arterial disease, mitral regurgitation. He is in my office today for follow-up overall cardiac wise doing well denies have any palpitations, no chest pain tightness squeezing pressure but just recently had bronchitis and recovering from it. Past Medical History:  Diagnosis Date   Allergy    Asbestos-induced pleural plaque 09/14/2017   Asthma    Carotid artery disease ([place])    a. Carotid US 8/17: RICA 50-69% // b. 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45; worked in asbestos; followed by PCP   RAD (reactive airway disease)    Snoring 03/18/2016   Tobacco dependence 03/18/2016   Type 2 diabetes mellitus with hyperlipidemia [facility])     Past Surgical History:  Procedure Laterality Date   CATARACT EXTRACTION Bilateral    PACEMAKER IMPLANT N/A 11/06/2020   Procedure: PACEMAKER IMPLANT;  Surgeon: Constance Haw, MD;  Location: [place] [facility];  Service: Cardiovascular;  Laterality: N/A;   SKIN GRAFT Right    Right 4th finger   TONSILLECTOMY      Current Medications: Current Meds  Medication Sig   acetaminophen (TYLENOL) 325 MG tablet Take 1-2 tablets (325-650 mg total) by mouth every 4 (four) hours as needed for  mild pain.   aspirin EC 81 MG tablet Take 81 mg by mouth daily.   atorvastatin (LIPITOR) 80 MG tablet Take 80 mg by mouth daily.    cetirizine (ZYRTEC) 10 MG tablet Take 10 mg by mouth daily.   Fluticasone-Umeclidin-Vilant (TRELEGY ELLIPTA) 100-62.5-25 MCG/INH AEPB Inhale 1 puff into the lungs daily.   PROAIR HFA 108 (90 Base) MCG/ACT inhaler Inhale 2 puffs into the lungs 4 (four) times daily as needed for wheezing or shortness of breath.   valsartan (DIOVAN) 80 MG tablet Take 80 mg by mouth daily.     Allergies:   Patient has no known allergies.   Social History   Socioeconomic History   Marital status: Married    Spouse name: Not on file   Number of children: Not on file   Years of education: Not on file   Highest education level: Not on file  Occupational History   Not on file  Tobacco Use   Smoking status: Every Day    Packs/day: 0.50    Years: 60.00    Total pack years: 30.00    Types: Cigarettes    Last attempt to quit: 02/28/2016    Years since quitting: 6.4   Smokeless tobacco: Never   Tobacco comments:    2 or 3 cigs a day, on chantix  Vaping Use   Vaping Use: Never used  Substance and Sexual Activity   Alcohol use: No  Comment: social   Drug use: No   Sexual activity: Not on file  Other Topics Concern   Not on file  Social History Narrative   Retired   Worked maintenance for [employer]   Prior worked with [employer]   Wife - Inez Catalina (married in 2014)   2 sons   2 grandsons   Lives in [place], [place]   Social Determinants of Health   Financial Resource Strain: Not on file  Food Insecurity: Not on file  Transportation Needs: Not on file  Physical Activity: Not on file  Stress: Not on file  Social Connections: Not on file     Family History: The patient's family history includes Atrial fibrillation in his brother; Bladder Cancer in his brother; Heart disease in his father; Hypertension in his mother; Lung cancer in his brother; Skin cancer  in his father; Stroke in his mother. ROS:   Please see the history of present illness.    All 14 point review of systems negative except as described per history of present illness  EKGs/Labs/Other Studies Reviewed:      Recent Labs: No results found for requested labs within last 365 days.  Recent Lipid Panel No results found for: "CHOL", "TRIG", "HDL", "CHOLHDL", "VLDL", "LDLCALC", "LDLDIRECT"  Physical Exam:    VS:  BP 132/74 (BP Location: Left Arm, Patient Position: Sitting)   Pulse (!) 52   Ht '5\' 10"'$  (1.778 m)   Wt 155 lb (70.3 kg)   SpO2 97%   BMI 22.24 kg/m     Wt Readings from Last 3 Encounters:  07/30/22 155 lb (70.3 kg)  11/05/21 155 lb 3.2 oz (70.4 kg)  05/08/21 150 lb (68 kg)     GEN:  Well nourished, well developed in no acute distress HEENT: Normal NECK: No JVD; No carotid bruits LYMPHATICS: No lymphadenopathy CARDIAC: RRR, no murmurs, no rubs, no gallops RESPIRATORY:  Clear to auscultation without rales, wheezing or rhonchi  ABDOMEN: Soft, non-tender, non-distended MUSCULOSKELETAL:  No edema; No deformity  SKIN: Warm and dry LOWER EXTREMITIES: no swelling NEUROLOGIC:  Alert and oriented x 3 PSYCHIATRIC:  Normal affect   ASSESSMENT:    1. Stenosis of right carotid artery   2. Second degree AV block   3. Right bundle branch block   4. Primary hypertension   5. Dyspnea on exertion   6. Tobacco dependence    PLAN:    In order of problems listed above:  Carotic arterial stenosis will repeat another carotic ultrasound, carotic ultrasound from 2 years did not show significant stenosis but previously reported up to 60% narrowing.  That need to be verified.  Likely he does not have any TIA/CVA-like symptoms.  He is on antiplatelet therapy which I will continue. Dyslipidemia last time I seen him he is close it was horrible now I have data from September 2023 that I reviewed LDL of 50 HDL 48 excellent control Lipitor 80 which I will continue. Smoking:  Still ongoing aspirin at least 5 minutes talking to her about need to quit he understand he will try to do it I gave him all his how we can try to accomplish this but honestly he told me that he tried to quit before he was able to stay without sugar for 1 day or 2. Mitral regurgitation plan to repeat his echocardiogram which we will do   Medication Adjustments/Labs and Tests Ordered: Current medicines are reviewed at length with the patient today.  Concerns regarding medicines are  outlined above.  No orders of the defined types were placed in this encounter.  Medication changes: No orders of the defined types were placed in this encounter.   Signed, Park Liter, MD, [facility] 07/30/2022 10:54 AM    [place]

## 2022-08-06 ENCOUNTER — Ambulatory Visit (INDEPENDENT_AMBULATORY_CARE_PROVIDER_SITE_OTHER): Payer: Medicare HMO

## 2022-08-06 DIAGNOSIS — I441 Atrioventricular block, second degree: Secondary | ICD-10-CM | POA: Diagnosis not present

## 2022-08-06 LAB — CUP PACEART REMOTE DEVICE CHECK
Battery Remaining Longevity: 100 mo
Battery Remaining Percentage: 84 %
Battery Voltage: 3.01 V
Brady Statistic AP VP Percent: 15 %
Brady Statistic AP VS Percent: 1 %
Brady Statistic AS VP Percent: 83 %
Brady Statistic AS VS Percent: 2 %
Brady Statistic RA Percent Paced: 15 %
Brady Statistic RV Percent Paced: 98 %
Date Time Interrogation Session: 20240201021215
Implantable Lead Connection Status: 753985
Implantable Lead Connection Status: 753985
Implantable Lead Implant Date: 20220504
Implantable Lead Implant Date: 20220504
Implantable Lead Location: 753859
Implantable Lead Location: 753860
Implantable Pulse Generator Implant Date: 20220504
Lead Channel Impedance Value: 410 Ohm
Lead Channel Impedance Value: 410 Ohm
Lead Channel Pacing Threshold Amplitude: 0.5 V
Lead Channel Pacing Threshold Amplitude: 0.625 V
Lead Channel Pacing Threshold Pulse Width: 0.5 ms
Lead Channel Pacing Threshold Pulse Width: 0.5 ms
Lead Channel Sensing Intrinsic Amplitude: 5 mV
Lead Channel Sensing Intrinsic Amplitude: 5.9 mV
Lead Channel Setting Pacing Amplitude: 0.875
Lead Channel Setting Pacing Amplitude: 1.5 V
Lead Channel Setting Pacing Pulse Width: 0.5 ms
Lead Channel Setting Sensing Sensitivity: 2 mV
Pulse Gen Model: 2272
Pulse Gen Serial Number: 3920934

## 2022-08-10 ENCOUNTER — Ambulatory Visit: Payer: Medicare HMO

## 2022-08-11 ENCOUNTER — Encounter: Payer: Self-pay | Admitting: Cardiology

## 2022-08-25 ENCOUNTER — Ambulatory Visit (INDEPENDENT_AMBULATORY_CARE_PROVIDER_SITE_OTHER): Payer: Medicare HMO

## 2022-08-25 ENCOUNTER — Ambulatory Visit: Payer: Medicare HMO | Attending: Cardiology

## 2022-08-25 DIAGNOSIS — R0609 Other forms of dyspnea: Secondary | ICD-10-CM | POA: Diagnosis not present

## 2022-08-25 DIAGNOSIS — I6521 Occlusion and stenosis of right carotid artery: Secondary | ICD-10-CM | POA: Diagnosis not present

## 2022-08-25 LAB — ECHOCARDIOGRAM COMPLETE
Area-P 1/2: 2.54 cm2
Calc EF: 49.1 %
S' Lateral: 3 cm
Single Plane A2C EF: 52.1 %
Single Plane A4C EF: 44.1 %

## 2022-08-25 NOTE — Progress Notes (Signed)
Exam observed by Enrigue Catena

## 2022-08-26 NOTE — Progress Notes (Signed)
Remote pacemaker transmission.   

## 2022-08-27 ENCOUNTER — Telehealth: Payer: Self-pay

## 2022-08-27 NOTE — Telephone Encounter (Signed)
Results reviewed with pt as per Dr. Krasowski's note.  Pt verbalized understanding and had no additional questions. Routed to PCP  

## 2022-09-01 ENCOUNTER — Telehealth: Payer: Self-pay

## 2022-09-01 NOTE — Telephone Encounter (Signed)
Results reviewed with pt's spouse Ruby- Per DPR- as per Dr. Wendy Poet note.  Pt verbalized understanding and had no additional questions. Routed to PCP

## 2022-09-03 DIAGNOSIS — I10 Essential (primary) hypertension: Secondary | ICD-10-CM | POA: Diagnosis not present

## 2022-09-03 DIAGNOSIS — E1169 Type 2 diabetes mellitus with other specified complication: Secondary | ICD-10-CM | POA: Diagnosis not present

## 2022-09-03 DIAGNOSIS — E785 Hyperlipidemia, unspecified: Secondary | ICD-10-CM | POA: Diagnosis not present

## 2022-09-03 DIAGNOSIS — J449 Chronic obstructive pulmonary disease, unspecified: Secondary | ICD-10-CM | POA: Diagnosis not present

## 2022-09-24 DIAGNOSIS — H353211 Exudative age-related macular degeneration, right eye, with active choroidal neovascularization: Secondary | ICD-10-CM | POA: Diagnosis not present

## 2022-09-24 DIAGNOSIS — E119 Type 2 diabetes mellitus without complications: Secondary | ICD-10-CM | POA: Diagnosis not present

## 2022-09-24 DIAGNOSIS — H353121 Nonexudative age-related macular degeneration, left eye, early dry stage: Secondary | ICD-10-CM | POA: Diagnosis not present

## 2022-09-30 DIAGNOSIS — L821 Other seborrheic keratosis: Secondary | ICD-10-CM | POA: Diagnosis not present

## 2022-11-05 ENCOUNTER — Ambulatory Visit (INDEPENDENT_AMBULATORY_CARE_PROVIDER_SITE_OTHER): Payer: Medicare HMO

## 2022-11-05 DIAGNOSIS — I441 Atrioventricular block, second degree: Secondary | ICD-10-CM

## 2022-11-05 LAB — CUP PACEART REMOTE DEVICE CHECK
Battery Remaining Longevity: 97 mo
Battery Remaining Percentage: 82 %
Battery Voltage: 3.01 V
Brady Statistic AP VP Percent: 13 %
Brady Statistic AP VS Percent: 1 %
Brady Statistic AS VP Percent: 81 %
Brady Statistic AS VS Percent: 5.3 %
Brady Statistic RA Percent Paced: 14 %
Brady Statistic RV Percent Paced: 94 %
Date Time Interrogation Session: 20240502035125
Implantable Lead Connection Status: 753985
Implantable Lead Connection Status: 753985
Implantable Lead Implant Date: 20220504
Implantable Lead Implant Date: 20220504
Implantable Lead Location: 753859
Implantable Lead Location: 753860
Implantable Pulse Generator Implant Date: 20220504
Lead Channel Impedance Value: 390 Ohm
Lead Channel Impedance Value: 410 Ohm
Lead Channel Pacing Threshold Amplitude: 0.5 V
Lead Channel Pacing Threshold Amplitude: 0.625 V
Lead Channel Pacing Threshold Pulse Width: 0.5 ms
Lead Channel Pacing Threshold Pulse Width: 0.5 ms
Lead Channel Sensing Intrinsic Amplitude: 5 mV
Lead Channel Sensing Intrinsic Amplitude: 8.2 mV
Lead Channel Setting Pacing Amplitude: 0.875
Lead Channel Setting Pacing Amplitude: 1.5 V
Lead Channel Setting Pacing Pulse Width: 0.5 ms
Lead Channel Setting Sensing Sensitivity: 2 mV
Pulse Gen Model: 2272
Pulse Gen Serial Number: 3920934

## 2022-11-25 NOTE — Progress Notes (Signed)
Remote pacemaker transmission.   

## 2022-12-23 DIAGNOSIS — E785 Hyperlipidemia, unspecified: Secondary | ICD-10-CM | POA: Diagnosis not present

## 2022-12-23 DIAGNOSIS — E1169 Type 2 diabetes mellitus with other specified complication: Secondary | ICD-10-CM | POA: Diagnosis not present

## 2023-02-04 ENCOUNTER — Ambulatory Visit (INDEPENDENT_AMBULATORY_CARE_PROVIDER_SITE_OTHER): Payer: Medicare HMO

## 2023-02-04 DIAGNOSIS — I441 Atrioventricular block, second degree: Secondary | ICD-10-CM | POA: Diagnosis not present

## 2023-02-04 LAB — CUP PACEART REMOTE DEVICE CHECK
Battery Remaining Longevity: 91 mo
Battery Remaining Percentage: 79 %
Battery Voltage: 3.01 V
Brady Statistic AP VP Percent: 13 %
Brady Statistic AP VS Percent: 1 %
Brady Statistic AS VP Percent: 82 %
Brady Statistic AS VS Percent: 5.2 %
Brady Statistic RA Percent Paced: 13 %
Brady Statistic RV Percent Paced: 94 %
Date Time Interrogation Session: 20240801023152
Implantable Lead Connection Status: 753985
Implantable Lead Connection Status: 753985
Implantable Lead Implant Date: 20220504
Implantable Lead Implant Date: 20220504
Implantable Lead Location: 753859
Implantable Lead Location: 753860
Implantable Pulse Generator Implant Date: 20220504
Lead Channel Impedance Value: 390 Ohm
Lead Channel Impedance Value: 400 Ohm
Lead Channel Pacing Threshold Amplitude: 0.5 V
Lead Channel Pacing Threshold Amplitude: 0.75 V
Lead Channel Pacing Threshold Pulse Width: 0.5 ms
Lead Channel Pacing Threshold Pulse Width: 0.5 ms
Lead Channel Sensing Intrinsic Amplitude: 5 mV
Lead Channel Sensing Intrinsic Amplitude: 7.7 mV
Lead Channel Setting Pacing Amplitude: 1 V
Lead Channel Setting Pacing Amplitude: 1.5 V
Lead Channel Setting Pacing Pulse Width: 0.5 ms
Lead Channel Setting Sensing Sensitivity: 2 mV
Pulse Gen Model: 2272
Pulse Gen Serial Number: 3920934

## 2023-02-17 NOTE — Progress Notes (Signed)
Remote pacemaker transmission.   

## 2023-05-06 ENCOUNTER — Ambulatory Visit (INDEPENDENT_AMBULATORY_CARE_PROVIDER_SITE_OTHER): Payer: Medicare HMO

## 2023-05-06 DIAGNOSIS — I441 Atrioventricular block, second degree: Secondary | ICD-10-CM | POA: Diagnosis not present

## 2023-05-06 LAB — CUP PACEART REMOTE DEVICE CHECK
Battery Remaining Longevity: 86 mo
Battery Remaining Percentage: 76 %
Battery Voltage: 3.01 V
Brady Statistic AP VP Percent: 12 %
Brady Statistic AP VS Percent: 1 %
Brady Statistic AS VP Percent: 83 %
Brady Statistic AS VS Percent: 4.6 %
Brady Statistic RA Percent Paced: 12 %
Brady Statistic RV Percent Paced: 95 %
Date Time Interrogation Session: 20241031051557
Implantable Lead Connection Status: 753985
Implantable Lead Connection Status: 753985
Implantable Lead Implant Date: 20220504
Implantable Lead Implant Date: 20220504
Implantable Lead Location: 753859
Implantable Lead Location: 753860
Implantable Pulse Generator Implant Date: 20220504
Lead Channel Impedance Value: 360 Ohm
Lead Channel Impedance Value: 390 Ohm
Lead Channel Pacing Threshold Amplitude: 0.5 V
Lead Channel Pacing Threshold Amplitude: 0.875 V
Lead Channel Pacing Threshold Pulse Width: 0.5 ms
Lead Channel Pacing Threshold Pulse Width: 0.5 ms
Lead Channel Sensing Intrinsic Amplitude: 4.9 mV
Lead Channel Sensing Intrinsic Amplitude: 9.1 mV
Lead Channel Setting Pacing Amplitude: 1.125
Lead Channel Setting Pacing Amplitude: 1.5 V
Lead Channel Setting Pacing Pulse Width: 0.5 ms
Lead Channel Setting Sensing Sensitivity: 2 mV
Pulse Gen Model: 2272
Pulse Gen Serial Number: 3920934

## 2023-05-14 DIAGNOSIS — Z23 Encounter for immunization: Secondary | ICD-10-CM | POA: Diagnosis not present

## 2023-05-19 NOTE — Progress Notes (Signed)
Remote pacemaker transmission.   

## 2023-06-24 DIAGNOSIS — E785 Hyperlipidemia, unspecified: Secondary | ICD-10-CM | POA: Diagnosis not present

## 2023-06-24 DIAGNOSIS — I10 Essential (primary) hypertension: Secondary | ICD-10-CM | POA: Diagnosis not present

## 2023-06-24 DIAGNOSIS — E1169 Type 2 diabetes mellitus with other specified complication: Secondary | ICD-10-CM | POA: Diagnosis not present

## 2023-08-05 ENCOUNTER — Ambulatory Visit: Payer: Medicare HMO

## 2023-08-05 DIAGNOSIS — I441 Atrioventricular block, second degree: Secondary | ICD-10-CM

## 2023-08-05 LAB — CUP PACEART REMOTE DEVICE CHECK
Battery Remaining Longevity: 83 mo
Battery Remaining Percentage: 74 %
Battery Voltage: 3.01 V
Brady Statistic AP VP Percent: 12 %
Brady Statistic AP VS Percent: 1 %
Brady Statistic AS VP Percent: 83 %
Brady Statistic AS VS Percent: 4.4 %
Brady Statistic RA Percent Paced: 12 %
Brady Statistic RV Percent Paced: 95 %
Date Time Interrogation Session: 20250130020015
Implantable Lead Connection Status: 753985
Implantable Lead Connection Status: 753985
Implantable Lead Implant Date: 20220504
Implantable Lead Implant Date: 20220504
Implantable Lead Location: 753859
Implantable Lead Location: 753860
Implantable Pulse Generator Implant Date: 20220504
Lead Channel Impedance Value: 360 Ohm
Lead Channel Impedance Value: 390 Ohm
Lead Channel Pacing Threshold Amplitude: 0.5 V
Lead Channel Pacing Threshold Amplitude: 1 V
Lead Channel Pacing Threshold Pulse Width: 0.5 ms
Lead Channel Pacing Threshold Pulse Width: 0.5 ms
Lead Channel Sensing Intrinsic Amplitude: 4.3 mV
Lead Channel Sensing Intrinsic Amplitude: 7.2 mV
Lead Channel Setting Pacing Amplitude: 1.25 V
Lead Channel Setting Pacing Amplitude: 1.5 V
Lead Channel Setting Pacing Pulse Width: 0.5 ms
Lead Channel Setting Sensing Sensitivity: 2 mV
Pulse Gen Model: 2272
Pulse Gen Serial Number: 3920934

## 2023-09-10 NOTE — Progress Notes (Signed)
 Remote pacemaker transmission.

## 2023-10-06 DIAGNOSIS — L820 Inflamed seborrheic keratosis: Secondary | ICD-10-CM | POA: Diagnosis not present

## 2023-11-04 ENCOUNTER — Ambulatory Visit (INDEPENDENT_AMBULATORY_CARE_PROVIDER_SITE_OTHER): Payer: Medicare HMO

## 2023-11-04 DIAGNOSIS — I441 Atrioventricular block, second degree: Secondary | ICD-10-CM

## 2023-11-04 LAB — CUP PACEART REMOTE DEVICE CHECK
Battery Remaining Longevity: 80 mo
Battery Remaining Percentage: 71 %
Battery Voltage: 2.99 V
Brady Statistic AP VP Percent: 11 %
Brady Statistic AP VS Percent: 1 %
Brady Statistic AS VP Percent: 84 %
Brady Statistic AS VS Percent: 4.5 %
Brady Statistic RA Percent Paced: 11 %
Brady Statistic RV Percent Paced: 95 %
Date Time Interrogation Session: 20250501023608
Implantable Lead Connection Status: 753985
Implantable Lead Connection Status: 753985
Implantable Lead Implant Date: 20220504
Implantable Lead Implant Date: 20220504
Implantable Lead Location: 753859
Implantable Lead Location: 753860
Implantable Pulse Generator Implant Date: 20220504
Lead Channel Impedance Value: 360 Ohm
Lead Channel Impedance Value: 390 Ohm
Lead Channel Pacing Threshold Amplitude: 0.5 V
Lead Channel Pacing Threshold Amplitude: 0.875 V
Lead Channel Pacing Threshold Pulse Width: 0.5 ms
Lead Channel Pacing Threshold Pulse Width: 0.5 ms
Lead Channel Sensing Intrinsic Amplitude: 10.5 mV
Lead Channel Sensing Intrinsic Amplitude: 4.7 mV
Lead Channel Setting Pacing Amplitude: 1.125
Lead Channel Setting Pacing Amplitude: 1.5 V
Lead Channel Setting Pacing Pulse Width: 0.5 ms
Lead Channel Setting Sensing Sensitivity: 2 mV
Pulse Gen Model: 2272
Pulse Gen Serial Number: 3920934

## 2023-12-16 NOTE — Progress Notes (Signed)
 Remote pacemaker transmission.

## 2023-12-22 ENCOUNTER — Ambulatory Visit: Attending: Cardiology | Admitting: Cardiology

## 2023-12-22 VITALS — BP 112/50 | HR 81 | Ht 70.0 in | Wt 144.4 lb

## 2023-12-22 DIAGNOSIS — J4489 Other specified chronic obstructive pulmonary disease: Secondary | ICD-10-CM

## 2023-12-22 DIAGNOSIS — I10 Essential (primary) hypertension: Secondary | ICD-10-CM | POA: Diagnosis not present

## 2023-12-22 DIAGNOSIS — E1169 Type 2 diabetes mellitus with other specified complication: Secondary | ICD-10-CM

## 2023-12-22 DIAGNOSIS — E785 Hyperlipidemia, unspecified: Secondary | ICD-10-CM

## 2023-12-22 DIAGNOSIS — I6521 Occlusion and stenosis of right carotid artery: Secondary | ICD-10-CM | POA: Diagnosis not present

## 2023-12-22 DIAGNOSIS — E782 Mixed hyperlipidemia: Secondary | ICD-10-CM

## 2023-12-22 DIAGNOSIS — J439 Emphysema, unspecified: Secondary | ICD-10-CM | POA: Diagnosis not present

## 2023-12-22 DIAGNOSIS — I4510 Unspecified right bundle-branch block: Secondary | ICD-10-CM | POA: Diagnosis not present

## 2023-12-22 NOTE — Patient Instructions (Signed)

## 2023-12-22 NOTE — Progress Notes (Signed)
 Cardiology Office Note:    Date:  12/22/2023   ID:  Nelson Noone, DOB [birth year], MRN [id number]  PCP:  Adrian Hopper, MD  Cardiologist:  Ralene Burger, MD    Referring MD: Adrian Hopper, MD   Chief Complaint  Patient presents with   Follow-up    History of Present Illness:    John Wiggins is a 83 y.o. male past medical history significant for second-degree AV block, pacemaker, COPD still smoking, essential hypertension, carotic arterial disease only mild, mitral regurgitation.  Comes today to my office for follow-up overall doing great.  Asymptomatic, denies have any chest pain tightness squeezing pressure burning chest.  Sadly he still continues to smoke  Past Medical History:  Diagnosis Date   Allergy     Asbestos-induced pleural plaque 09/14/2017   Asthma    Carotid artery disease ([facility])    a. Carotid US  8/17: RICA 50-69% // b. 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 63; worked in asbestos; followed by PCP   RAD (reactive airway disease)    Snoring 03/18/2016   Tobacco dependence 03/18/2016   Type 2 diabetes mellitus with hyperlipidemia [facility])     Past Surgical History:  Procedure Laterality Date   CATARACT EXTRACTION Bilateral    PACEMAKER IMPLANT N/A 11/06/2020   Procedure: PACEMAKER IMPLANT;  Surgeon: Lei Pump, MD;  [facility];  Service: Cardiovascular;  Laterality: N/A;   SKIN GRAFT Right    Right 4th finger   TONSILLECTOMY      Current Medications: Current Meds  Medication Sig   acetaminophen  (TYLENOL ) 325 MG tablet Take 1-2 tablets (325-650 mg total) by mouth every 4 (four) hours as needed for mild pain.   aspirin EC 81 MG tablet Take 81  mg by mouth daily.   atorvastatin  (LIPITOR ) 80 MG tablet Take 80 mg by mouth daily.    cetirizine (ZYRTEC) 10 MG tablet Take 10 mg by mouth daily.   Fluticasone -Umeclidin-Vilant (TRELEGY ELLIPTA) 100-62.5-25 MCG/INH AEPB Inhale 1 puff into the lungs daily.   PROAIR  HFA 108 (90 Base) MCG/ACT inhaler Inhale 2 puffs into the lungs 4 (four) times daily as needed for wheezing or shortness of breath.   valsartan (DIOVAN) 80 MG tablet Take 80 mg by mouth daily.     Allergies:   Patient has no known allergies.   Social History   Socioeconomic History   Marital status: Married    Spouse name: Not on file   Number of children: Not on file   Years of education: Not on file   Highest education level: Not on file  Occupational History   Not on file  Tobacco Use   Smoking status: Every Day    Current packs/day: 0.00    Average packs/day: 0.5 packs/day for 60.0 years (30.0 ttl pk-yrs)    Types: Cigarettes    Start date: 02/28/1956    Last attempt to quit: 02/28/2016    Years since quitting: 7.8   Smokeless tobacco: Never   Tobacco comments:    2 or 3 cigs a day, on chantix   Vaping Use   Vaping status: Never Used  Substance and Sexual Activity   Alcohol  use: No    Comment: social   Drug use: No   Sexual activity: Not on file  Other Topics Concern   Not on file  Social History Narrative   Retired   Worked maintenance for [employer]   Prior worked with Network engineer company   Wife - Ninette Basque (married in 2014)   2 sons   2 grandsons   Lives in [place], [place]   Social Drivers of Corporate investment banker Strain: Not on file  Food Insecurity: Not on file  Transportation Needs: Not on file  Physical Activity: Not on file  Stress: Not on file  Social Connections: Not on file     Family History: The patient's family history includes Atrial fibrillation in his brother; Bladder Cancer in his brother; Heart disease in his father; Hypertension in his mother; Lung cancer in his brother;  Skin cancer in his father; Stroke in his mother. ROS:   Please see the history of present illness.    All 14 point review of systems negative except as described per history of present illness  EKGs/Labs/Other Studies Reviewed:    EKG Interpretation Date/Time:  Wednesday December 22 2023 13:21:54 EDT Ventricular Rate:  81 PR Interval:  194 QRS Duration:  148 QT Interval:  412 QTC Calculation: 478 R Axis:   -84  Text Interpretation: Atrial-sensed ventricular-paced rhythm Abnormal ECG When compared with ECG of 07-Nov-2020 03:54, Vent. rate has increased BY  16 BPM Confirmed by Ralene Burger [phone]) on 12/22/2023 1:48:53 PM    Recent Labs: No results found for requested labs within last 365 days.  Recent Lipid Panel No results found for: CHOL, TRIG, HDL, CHOLHDL, VLDL, LDLCALC, LDLDIRECT  Physical Exam:    VS:  BP (!) 112/50 (BP Location: Right Arm, Patient Position: Sitting)   Pulse 81   Ht 5' 10 (1.778 m)   Wt 144 lb 6.4 oz (65.5 kg)   SpO2 93%   BMI 20.72 kg/m     Wt Readings from Last 3 Encounters:  12/22/23 144 lb 6.4 oz (65.5 kg)  07/30/22 155 lb (70.3 kg)  11/05/21 155 lb 3.2 oz (70.4 kg)     GEN:  Well nourished, well developed in no acute distress HEENT: Normal NECK: No JVD; No carotid bruits LYMPHATICS: No lymphadenopathy CARDIAC: RRR, no murmurs, no rubs, no gallops RESPIRATORY:  Clear to auscultation without rales, wheezing or rhonchi overinflated ABDOMEN: Soft, non-tender, non-distended MUSCULOSKELETAL:  No edema; No deformity  SKIN: Warm and dry LOWER EXTREMITIES: no swelling NEUROLOGIC:  Alert and oriented x 3 PSYCHIATRIC:  Normal affect   ASSESSMENT:    1. Primary hypertension   2. Stenosis of right carotid artery   3. Right bundle branch block   4. COPD with chronic bronchitis and emphysema ([facility])   5. Type 2 diabetes mellitus with hyperlipidemia ([facility])   6. Mixed hyperlipidemia    PLAN:    In order of problems listed  above:  Essential hypertension: Blood pressure well-controlled continue present management. Carotic artery stenosis only mild.  Next year we will repeat carotic ultrasounds I did review with the patient last carotic ultrasound. Right bundle branch block most likely related to his COPD. Type 2 diabetes followed by antimedicine team I did review K PN show me hemoglobin A1c of 6.1 from 06/24/2023. Dyslipidemia on appropriate medication LDL 57 HDL 39 continue present management   Medication Adjustments/Labs and Tests Ordered: Current medicines are reviewed at length with the patient today.  Concerns regarding medicines are  outlined above.  Orders Placed This Encounter  Procedures   EKG 12-Lead   Medication changes: No orders of the defined types were placed in this encounter.   Signed, Manfred Seed, MD, [facility] 12/22/2023 1:55 PM    [facility]

## 2023-12-29 DIAGNOSIS — G8192 Hemiplegia, unspecified affecting left dominant side: Secondary | ICD-10-CM | POA: Diagnosis not present

## 2023-12-29 DIAGNOSIS — Z9181 History of falling: Secondary | ICD-10-CM | POA: Diagnosis not present

## 2023-12-29 DIAGNOSIS — J4489 Other specified chronic obstructive pulmonary disease: Secondary | ICD-10-CM | POA: Diagnosis not present

## 2023-12-29 DIAGNOSIS — E1169 Type 2 diabetes mellitus with other specified complication: Secondary | ICD-10-CM | POA: Diagnosis not present

## 2023-12-29 DIAGNOSIS — E785 Hyperlipidemia, unspecified: Secondary | ICD-10-CM | POA: Diagnosis not present

## 2023-12-29 DIAGNOSIS — Z0000 Encounter for general adult medical examination without abnormal findings: Secondary | ICD-10-CM | POA: Diagnosis not present

## 2023-12-29 DIAGNOSIS — I10 Essential (primary) hypertension: Secondary | ICD-10-CM | POA: Diagnosis not present

## 2024-02-03 ENCOUNTER — Ambulatory Visit: Payer: Medicare HMO

## 2024-02-03 DIAGNOSIS — I441 Atrioventricular block, second degree: Secondary | ICD-10-CM

## 2024-02-03 LAB — CUP PACEART REMOTE DEVICE CHECK
Battery Remaining Longevity: 78 mo
Battery Remaining Percentage: 68 %
Battery Voltage: 2.99 V
Brady Statistic AP VP Percent: 11 %
Brady Statistic AP VS Percent: 1 %
Brady Statistic AS VP Percent: 85 %
Brady Statistic AS VS Percent: 4.1 %
Brady Statistic RA Percent Paced: 11 %
Brady Statistic RV Percent Paced: 95 %
Date Time Interrogation Session: 20250731020014
Implantable Lead Connection Status: 753985
Implantable Lead Connection Status: 753985
Implantable Lead Implant Date: 20220504
Implantable Lead Implant Date: 20220504
Implantable Lead Location: 753859
Implantable Lead Location: 753860
Implantable Pulse Generator Implant Date: 20220504
Lead Channel Impedance Value: 360 Ohm
Lead Channel Impedance Value: 410 Ohm
Lead Channel Pacing Threshold Amplitude: 0.5 V
Lead Channel Pacing Threshold Amplitude: 0.875 V
Lead Channel Pacing Threshold Pulse Width: 0.5 ms
Lead Channel Pacing Threshold Pulse Width: 0.5 ms
Lead Channel Sensing Intrinsic Amplitude: 5 mV
Lead Channel Sensing Intrinsic Amplitude: 6.7 mV
Lead Channel Setting Pacing Amplitude: 1.125
Lead Channel Setting Pacing Amplitude: 1.5 V
Lead Channel Setting Pacing Pulse Width: 0.5 ms
Lead Channel Setting Sensing Sensitivity: 2 mV
Pulse Gen Model: 2272
Pulse Gen Serial Number: 3920934

## 2024-02-04 ENCOUNTER — Ambulatory Visit: Payer: Self-pay | Admitting: Cardiology

## 2024-02-07 DIAGNOSIS — J209 Acute bronchitis, unspecified: Secondary | ICD-10-CM | POA: Diagnosis not present

## 2024-02-07 DIAGNOSIS — J440 Chronic obstructive pulmonary disease with acute lower respiratory infection: Secondary | ICD-10-CM | POA: Diagnosis not present

## 2024-02-15 DIAGNOSIS — J209 Acute bronchitis, unspecified: Secondary | ICD-10-CM | POA: Diagnosis not present

## 2024-02-15 DIAGNOSIS — R918 Other nonspecific abnormal finding of lung field: Secondary | ICD-10-CM | POA: Diagnosis not present

## 2024-02-15 DIAGNOSIS — J440 Chronic obstructive pulmonary disease with acute lower respiratory infection: Secondary | ICD-10-CM | POA: Diagnosis not present

## 2024-02-15 DIAGNOSIS — J449 Chronic obstructive pulmonary disease, unspecified: Secondary | ICD-10-CM | POA: Diagnosis not present

## 2024-02-28 DIAGNOSIS — R350 Frequency of micturition: Secondary | ICD-10-CM | POA: Diagnosis not present

## 2024-02-28 DIAGNOSIS — J440 Chronic obstructive pulmonary disease with acute lower respiratory infection: Secondary | ICD-10-CM | POA: Diagnosis not present

## 2024-03-13 DIAGNOSIS — Z77090 Contact with and (suspected) exposure to asbestos: Secondary | ICD-10-CM | POA: Diagnosis not present

## 2024-03-13 DIAGNOSIS — R911 Solitary pulmonary nodule: Secondary | ICD-10-CM | POA: Diagnosis not present

## 2024-03-13 DIAGNOSIS — J40 Bronchitis, not specified as acute or chronic: Secondary | ICD-10-CM | POA: Diagnosis not present

## 2024-03-13 DIAGNOSIS — I10 Essential (primary) hypertension: Secondary | ICD-10-CM | POA: Diagnosis not present

## 2024-03-13 DIAGNOSIS — R9431 Abnormal electrocardiogram [ECG] [EKG]: Secondary | ICD-10-CM | POA: Diagnosis not present

## 2024-03-13 DIAGNOSIS — R0602 Shortness of breath: Secondary | ICD-10-CM | POA: Diagnosis not present

## 2024-03-13 DIAGNOSIS — J441 Chronic obstructive pulmonary disease with (acute) exacerbation: Secondary | ICD-10-CM | POA: Diagnosis not present

## 2024-04-03 DIAGNOSIS — N401 Enlarged prostate with lower urinary tract symptoms: Secondary | ICD-10-CM | POA: Diagnosis not present

## 2024-04-05 NOTE — Progress Notes (Signed)
 Remote PPM Transmission

## 2024-04-07 DIAGNOSIS — Z125 Encounter for screening for malignant neoplasm of prostate: Secondary | ICD-10-CM | POA: Diagnosis not present

## 2024-04-13 DIAGNOSIS — R0602 Shortness of breath: Secondary | ICD-10-CM | POA: Diagnosis not present

## 2024-04-13 DIAGNOSIS — Z1152 Encounter for screening for COVID-19: Secondary | ICD-10-CM | POA: Diagnosis not present

## 2024-04-13 DIAGNOSIS — R530 Neoplastic (malignant) related fatigue: Secondary | ICD-10-CM | POA: Diagnosis not present

## 2024-04-13 DIAGNOSIS — E8809 Other disorders of plasma-protein metabolism, not elsewhere classified: Secondary | ICD-10-CM | POA: Diagnosis not present

## 2024-04-13 DIAGNOSIS — F17210 Nicotine dependence, cigarettes, uncomplicated: Secondary | ICD-10-CM | POA: Diagnosis not present

## 2024-04-13 DIAGNOSIS — I440 Atrioventricular block, first degree: Secondary | ICD-10-CM | POA: Diagnosis not present

## 2024-04-13 DIAGNOSIS — I444 Left anterior fascicular block: Secondary | ICD-10-CM | POA: Diagnosis not present

## 2024-04-13 DIAGNOSIS — I959 Hypotension, unspecified: Secondary | ICD-10-CM | POA: Diagnosis not present

## 2024-04-13 DIAGNOSIS — Z681 Body mass index (BMI) 19 or less, adult: Secondary | ICD-10-CM | POA: Diagnosis not present

## 2024-04-13 DIAGNOSIS — R918 Other nonspecific abnormal finding of lung field: Secondary | ICD-10-CM | POA: Diagnosis not present

## 2024-04-13 DIAGNOSIS — I10 Essential (primary) hypertension: Secondary | ICD-10-CM | POA: Diagnosis not present

## 2024-04-13 DIAGNOSIS — R590 Localized enlarged lymph nodes: Secondary | ICD-10-CM | POA: Diagnosis not present

## 2024-04-13 DIAGNOSIS — Z79899 Other long term (current) drug therapy: Secondary | ICD-10-CM | POA: Diagnosis not present

## 2024-04-13 DIAGNOSIS — Z23 Encounter for immunization: Secondary | ICD-10-CM | POA: Diagnosis not present

## 2024-04-13 DIAGNOSIS — R0603 Acute respiratory distress: Secondary | ICD-10-CM | POA: Diagnosis not present

## 2024-04-13 DIAGNOSIS — R000 Tachycardia, unspecified: Secondary | ICD-10-CM | POA: Diagnosis not present

## 2024-04-13 DIAGNOSIS — Z7951 Long term (current) use of inhaled steroids: Secondary | ICD-10-CM | POA: Diagnosis not present

## 2024-04-13 DIAGNOSIS — J441 Chronic obstructive pulmonary disease with (acute) exacerbation: Secondary | ICD-10-CM | POA: Diagnosis not present

## 2024-04-13 DIAGNOSIS — Z87442 Personal history of urinary calculi: Secondary | ICD-10-CM | POA: Diagnosis not present

## 2024-04-13 DIAGNOSIS — J439 Emphysema, unspecified: Secondary | ICD-10-CM | POA: Diagnosis not present

## 2024-04-13 DIAGNOSIS — I252 Old myocardial infarction: Secondary | ICD-10-CM | POA: Diagnosis not present

## 2024-04-13 DIAGNOSIS — E43 Unspecified severe protein-calorie malnutrition: Secondary | ICD-10-CM | POA: Diagnosis not present

## 2024-04-13 DIAGNOSIS — Z7982 Long term (current) use of aspirin: Secondary | ICD-10-CM | POA: Diagnosis not present

## 2024-04-13 DIAGNOSIS — Z950 Presence of cardiac pacemaker: Secondary | ICD-10-CM | POA: Diagnosis not present

## 2024-04-13 DIAGNOSIS — R531 Weakness: Secondary | ICD-10-CM | POA: Diagnosis not present

## 2024-04-18 DIAGNOSIS — E43 Unspecified severe protein-calorie malnutrition: Secondary | ICD-10-CM | POA: Diagnosis not present

## 2024-04-18 DIAGNOSIS — Z79899 Other long term (current) drug therapy: Secondary | ICD-10-CM | POA: Diagnosis not present

## 2024-04-18 DIAGNOSIS — I459 Conduction disorder, unspecified: Secondary | ICD-10-CM | POA: Diagnosis not present

## 2024-04-18 DIAGNOSIS — Z7982 Long term (current) use of aspirin: Secondary | ICD-10-CM | POA: Diagnosis not present

## 2024-04-18 DIAGNOSIS — R9431 Abnormal electrocardiogram [ECG] [EKG]: Secondary | ICD-10-CM | POA: Diagnosis not present

## 2024-04-18 DIAGNOSIS — J441 Chronic obstructive pulmonary disease with (acute) exacerbation: Secondary | ICD-10-CM | POA: Diagnosis not present

## 2024-04-18 DIAGNOSIS — I10 Essential (primary) hypertension: Secondary | ICD-10-CM | POA: Diagnosis not present

## 2024-04-18 DIAGNOSIS — R0600 Dyspnea, unspecified: Secondary | ICD-10-CM | POA: Diagnosis not present

## 2024-04-18 DIAGNOSIS — J9601 Acute respiratory failure with hypoxia: Secondary | ICD-10-CM | POA: Diagnosis not present

## 2024-04-18 DIAGNOSIS — R590 Localized enlarged lymph nodes: Secondary | ICD-10-CM | POA: Diagnosis not present

## 2024-04-18 DIAGNOSIS — E8809 Other disorders of plasma-protein metabolism, not elsewhere classified: Secondary | ICD-10-CM | POA: Diagnosis not present

## 2024-04-18 DIAGNOSIS — R531 Weakness: Secondary | ICD-10-CM | POA: Diagnosis not present

## 2024-04-18 DIAGNOSIS — R911 Solitary pulmonary nodule: Secondary | ICD-10-CM | POA: Diagnosis not present

## 2024-04-18 DIAGNOSIS — R55 Syncope and collapse: Secondary | ICD-10-CM | POA: Diagnosis not present

## 2024-04-18 DIAGNOSIS — Z720 Tobacco use: Secondary | ICD-10-CM | POA: Diagnosis not present

## 2024-04-18 DIAGNOSIS — I959 Hypotension, unspecified: Secondary | ICD-10-CM | POA: Diagnosis not present

## 2024-04-18 DIAGNOSIS — R0602 Shortness of breath: Secondary | ICD-10-CM | POA: Diagnosis not present

## 2024-04-20 DIAGNOSIS — R350 Frequency of micturition: Secondary | ICD-10-CM | POA: Diagnosis not present

## 2024-04-20 DIAGNOSIS — R918 Other nonspecific abnormal finding of lung field: Secondary | ICD-10-CM | POA: Diagnosis not present

## 2024-04-20 DIAGNOSIS — Z950 Presence of cardiac pacemaker: Secondary | ICD-10-CM | POA: Diagnosis not present

## 2024-04-20 DIAGNOSIS — E43 Unspecified severe protein-calorie malnutrition: Secondary | ICD-10-CM | POA: Diagnosis not present

## 2024-04-20 DIAGNOSIS — I959 Hypotension, unspecified: Secondary | ICD-10-CM | POA: Diagnosis not present

## 2024-04-20 DIAGNOSIS — E1169 Type 2 diabetes mellitus with other specified complication: Secondary | ICD-10-CM | POA: Diagnosis not present

## 2024-04-20 DIAGNOSIS — N401 Enlarged prostate with lower urinary tract symptoms: Secondary | ICD-10-CM | POA: Diagnosis not present

## 2024-04-20 DIAGNOSIS — Z87442 Personal history of urinary calculi: Secondary | ICD-10-CM | POA: Diagnosis not present

## 2024-04-20 DIAGNOSIS — G8192 Hemiplegia, unspecified affecting left dominant side: Secondary | ICD-10-CM | POA: Diagnosis not present

## 2024-04-20 DIAGNOSIS — E785 Hyperlipidemia, unspecified: Secondary | ICD-10-CM | POA: Diagnosis not present

## 2024-04-20 DIAGNOSIS — J439 Emphysema, unspecified: Secondary | ICD-10-CM | POA: Diagnosis not present

## 2024-04-20 DIAGNOSIS — Z7951 Long term (current) use of inhaled steroids: Secondary | ICD-10-CM | POA: Diagnosis not present

## 2024-04-20 DIAGNOSIS — I10 Essential (primary) hypertension: Secondary | ICD-10-CM | POA: Diagnosis not present

## 2024-04-20 DIAGNOSIS — E8809 Other disorders of plasma-protein metabolism, not elsewhere classified: Secondary | ICD-10-CM | POA: Diagnosis not present

## 2024-04-20 DIAGNOSIS — I6521 Occlusion and stenosis of right carotid artery: Secondary | ICD-10-CM | POA: Diagnosis not present

## 2024-04-20 DIAGNOSIS — F17210 Nicotine dependence, cigarettes, uncomplicated: Secondary | ICD-10-CM | POA: Diagnosis not present

## 2024-04-20 DIAGNOSIS — Z7982 Long term (current) use of aspirin: Secondary | ICD-10-CM | POA: Diagnosis not present

## 2024-04-20 DIAGNOSIS — J441 Chronic obstructive pulmonary disease with (acute) exacerbation: Secondary | ICD-10-CM | POA: Diagnosis not present

## 2024-04-20 DIAGNOSIS — Z8711 Personal history of peptic ulcer disease: Secondary | ICD-10-CM | POA: Diagnosis not present

## 2024-04-20 DIAGNOSIS — J920 Pleural plaque with presence of asbestos: Secondary | ICD-10-CM | POA: Diagnosis not present

## 2024-04-20 DIAGNOSIS — M545 Low back pain, unspecified: Secondary | ICD-10-CM | POA: Diagnosis not present

## 2024-04-20 DIAGNOSIS — R590 Localized enlarged lymph nodes: Secondary | ICD-10-CM | POA: Diagnosis not present

## 2024-04-20 DIAGNOSIS — R338 Other retention of urine: Secondary | ICD-10-CM | POA: Diagnosis not present

## 2024-04-20 DIAGNOSIS — Z556 Problems related to health literacy: Secondary | ICD-10-CM | POA: Diagnosis not present

## 2024-04-20 DIAGNOSIS — L89152 Pressure ulcer of sacral region, stage 2: Secondary | ICD-10-CM | POA: Diagnosis not present

## 2024-04-24 DIAGNOSIS — R590 Localized enlarged lymph nodes: Secondary | ICD-10-CM | POA: Diagnosis not present

## 2024-04-24 DIAGNOSIS — M545 Low back pain, unspecified: Secondary | ICD-10-CM | POA: Diagnosis not present

## 2024-04-24 DIAGNOSIS — E8809 Other disorders of plasma-protein metabolism, not elsewhere classified: Secondary | ICD-10-CM | POA: Diagnosis not present

## 2024-04-24 DIAGNOSIS — I959 Hypotension, unspecified: Secondary | ICD-10-CM | POA: Diagnosis not present

## 2024-04-24 DIAGNOSIS — R338 Other retention of urine: Secondary | ICD-10-CM | POA: Diagnosis not present

## 2024-04-24 DIAGNOSIS — Z7982 Long term (current) use of aspirin: Secondary | ICD-10-CM | POA: Diagnosis not present

## 2024-04-24 DIAGNOSIS — E1169 Type 2 diabetes mellitus with other specified complication: Secondary | ICD-10-CM | POA: Diagnosis not present

## 2024-04-24 DIAGNOSIS — Z8711 Personal history of peptic ulcer disease: Secondary | ICD-10-CM | POA: Diagnosis not present

## 2024-04-24 DIAGNOSIS — E43 Unspecified severe protein-calorie malnutrition: Secondary | ICD-10-CM | POA: Diagnosis not present

## 2024-04-24 DIAGNOSIS — J441 Chronic obstructive pulmonary disease with (acute) exacerbation: Secondary | ICD-10-CM | POA: Diagnosis not present

## 2024-04-24 DIAGNOSIS — Z7951 Long term (current) use of inhaled steroids: Secondary | ICD-10-CM | POA: Diagnosis not present

## 2024-04-24 DIAGNOSIS — L89152 Pressure ulcer of sacral region, stage 2: Secondary | ICD-10-CM | POA: Diagnosis not present

## 2024-04-24 DIAGNOSIS — Z87442 Personal history of urinary calculi: Secondary | ICD-10-CM | POA: Diagnosis not present

## 2024-04-24 DIAGNOSIS — N401 Enlarged prostate with lower urinary tract symptoms: Secondary | ICD-10-CM | POA: Diagnosis not present

## 2024-04-24 DIAGNOSIS — R911 Solitary pulmonary nodule: Secondary | ICD-10-CM | POA: Diagnosis not present

## 2024-04-24 DIAGNOSIS — F17210 Nicotine dependence, cigarettes, uncomplicated: Secondary | ICD-10-CM | POA: Diagnosis not present

## 2024-04-24 DIAGNOSIS — J4489 Other specified chronic obstructive pulmonary disease: Secondary | ICD-10-CM | POA: Diagnosis not present

## 2024-04-24 DIAGNOSIS — R350 Frequency of micturition: Secondary | ICD-10-CM | POA: Diagnosis not present

## 2024-04-24 DIAGNOSIS — G8192 Hemiplegia, unspecified affecting left dominant side: Secondary | ICD-10-CM | POA: Diagnosis not present

## 2024-04-24 DIAGNOSIS — E785 Hyperlipidemia, unspecified: Secondary | ICD-10-CM | POA: Diagnosis not present

## 2024-04-24 DIAGNOSIS — Z111 Encounter for screening for respiratory tuberculosis: Secondary | ICD-10-CM | POA: Diagnosis not present

## 2024-04-24 DIAGNOSIS — J920 Pleural plaque with presence of asbestos: Secondary | ICD-10-CM | POA: Diagnosis not present

## 2024-04-24 DIAGNOSIS — R918 Other nonspecific abnormal finding of lung field: Secondary | ICD-10-CM | POA: Diagnosis not present

## 2024-04-24 DIAGNOSIS — J439 Emphysema, unspecified: Secondary | ICD-10-CM | POA: Diagnosis not present

## 2024-04-24 DIAGNOSIS — I6521 Occlusion and stenosis of right carotid artery: Secondary | ICD-10-CM | POA: Diagnosis not present

## 2024-04-24 DIAGNOSIS — Z556 Problems related to health literacy: Secondary | ICD-10-CM | POA: Diagnosis not present

## 2024-04-24 DIAGNOSIS — Z950 Presence of cardiac pacemaker: Secondary | ICD-10-CM | POA: Diagnosis not present

## 2024-04-24 DIAGNOSIS — I10 Essential (primary) hypertension: Secondary | ICD-10-CM | POA: Diagnosis not present

## 2024-04-25 DIAGNOSIS — J439 Emphysema, unspecified: Secondary | ICD-10-CM | POA: Diagnosis not present

## 2024-04-27 DIAGNOSIS — R0602 Shortness of breath: Secondary | ICD-10-CM | POA: Diagnosis not present

## 2024-04-27 DIAGNOSIS — E43 Unspecified severe protein-calorie malnutrition: Secondary | ICD-10-CM | POA: Diagnosis not present

## 2024-04-27 DIAGNOSIS — J189 Pneumonia, unspecified organism: Secondary | ICD-10-CM | POA: Diagnosis not present

## 2024-04-27 DIAGNOSIS — I4510 Unspecified right bundle-branch block: Secondary | ICD-10-CM | POA: Diagnosis not present

## 2024-04-27 DIAGNOSIS — R591 Generalized enlarged lymph nodes: Secondary | ICD-10-CM | POA: Diagnosis not present

## 2024-04-27 DIAGNOSIS — Z681 Body mass index (BMI) 19 or less, adult: Secondary | ICD-10-CM | POA: Diagnosis not present

## 2024-04-27 DIAGNOSIS — R918 Other nonspecific abnormal finding of lung field: Secondary | ICD-10-CM | POA: Diagnosis not present

## 2024-04-27 DIAGNOSIS — J90 Pleural effusion, not elsewhere classified: Secondary | ICD-10-CM | POA: Diagnosis not present

## 2024-04-27 DIAGNOSIS — I959 Hypotension, unspecified: Secondary | ICD-10-CM | POA: Diagnosis not present

## 2024-04-27 DIAGNOSIS — J9601 Acute respiratory failure with hypoxia: Secondary | ICD-10-CM | POA: Diagnosis not present

## 2024-04-27 DIAGNOSIS — R609 Edema, unspecified: Secondary | ICD-10-CM | POA: Diagnosis not present

## 2024-04-27 DIAGNOSIS — R911 Solitary pulmonary nodule: Secondary | ICD-10-CM | POA: Diagnosis not present

## 2024-04-27 DIAGNOSIS — I444 Left anterior fascicular block: Secondary | ICD-10-CM | POA: Diagnosis not present

## 2024-04-27 DIAGNOSIS — E875 Hyperkalemia: Secondary | ICD-10-CM | POA: Diagnosis not present

## 2024-04-27 DIAGNOSIS — R0902 Hypoxemia: Secondary | ICD-10-CM | POA: Diagnosis not present

## 2024-04-27 DIAGNOSIS — I252 Old myocardial infarction: Secondary | ICD-10-CM | POA: Diagnosis not present

## 2024-04-27 DIAGNOSIS — Z743 Need for continuous supervision: Secondary | ICD-10-CM | POA: Diagnosis not present

## 2024-04-27 DIAGNOSIS — R9431 Abnormal electrocardiogram [ECG] [EKG]: Secondary | ICD-10-CM | POA: Diagnosis not present

## 2024-04-28 DIAGNOSIS — J8410 Pulmonary fibrosis, unspecified: Secondary | ICD-10-CM | POA: Diagnosis not present

## 2024-04-28 DIAGNOSIS — G4733 Obstructive sleep apnea (adult) (pediatric): Secondary | ICD-10-CM | POA: Diagnosis not present

## 2024-04-28 DIAGNOSIS — J9601 Acute respiratory failure with hypoxia: Secondary | ICD-10-CM | POA: Diagnosis not present

## 2024-04-28 DIAGNOSIS — E43 Unspecified severe protein-calorie malnutrition: Secondary | ICD-10-CM | POA: Diagnosis not present

## 2024-04-28 DIAGNOSIS — J189 Pneumonia, unspecified organism: Secondary | ICD-10-CM | POA: Diagnosis not present

## 2024-04-28 DIAGNOSIS — R600 Localized edema: Secondary | ICD-10-CM | POA: Diagnosis not present

## 2024-04-29 DIAGNOSIS — J9690 Respiratory failure, unspecified, unspecified whether with hypoxia or hypercapnia: Secondary | ICD-10-CM | POA: Diagnosis not present

## 2024-05-06 DEATH — deceased
